# Patient Record
Sex: Female | Born: 1990 | Race: White | Hispanic: No | Smoking: Current every day smoker
Health system: Southern US, Community
[De-identification: ages and names within clinical notes are randomized; demographics above are authoritative.]

## PROBLEM LIST (undated history)

## (undated) DIAGNOSIS — F99 Mental disorder, not otherwise specified: Secondary | ICD-10-CM

## (undated) DIAGNOSIS — L089 Local infection of the skin and subcutaneous tissue, unspecified: Secondary | ICD-10-CM

## (undated) DIAGNOSIS — N83209 Unspecified ovarian cyst, unspecified side: Secondary | ICD-10-CM

## (undated) DIAGNOSIS — K056 Periodontal disease, unspecified: Secondary | ICD-10-CM

## (undated) HISTORY — PX: OVARIAN CYST REMOVAL: SHX89

## (undated) HISTORY — PX: TONSILLECTOMY: SHX5217

## (undated) HISTORY — DX: Periodontal disease, unspecified: K05.6

## (undated) HISTORY — PX: MULTIPLE TOOTH EXTRACTIONS: SHX2053

## (undated) HISTORY — PX: WISDOM TOOTH EXTRACTION: SHX21

---
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
## 2010-05-28 ENCOUNTER — Emergency Department (HOSPITAL_COMMUNITY): Admission: EM | Admit: 2010-05-28 | Discharge: 2010-05-28 | Payer: Self-pay | Admitting: Family Medicine

## 2010-11-29 ENCOUNTER — Encounter: Payer: Self-pay | Admitting: Family Medicine

## 2010-12-29 ENCOUNTER — Inpatient Hospital Stay (INDEPENDENT_AMBULATORY_CARE_PROVIDER_SITE_OTHER)
Admission: RE | Admit: 2010-12-29 | Discharge: 2010-12-29 | Disposition: A | Discharge status: Home / Self Care | Payer: Self-pay | Source: Ambulatory Visit | Attending: Family Medicine | Admitting: Family Medicine

## 2010-12-29 DIAGNOSIS — F411 Generalized anxiety disorder: Secondary | ICD-10-CM

## 2010-12-29 DIAGNOSIS — N739 Female pelvic inflammatory disease, unspecified: Secondary | ICD-10-CM

## 2010-12-29 LAB — POCT URINALYSIS DIPSTICK
Ketones, ur: NEGATIVE mg/dL
Nitrite: NEGATIVE
Protein, ur: 30 mg/dL — AB
Urobilinogen, UA: 0.2 mg/dL (ref 0.0–1.0)

## 2010-12-29 LAB — WET PREP, GENITAL: Trich, Wet Prep: NONE SEEN

## 2010-12-30 LAB — HIV ANTIBODY (ROUTINE TESTING W REFLEX): HIV: NONREACTIVE

## 2011-01-18 ENCOUNTER — Inpatient Hospital Stay (INDEPENDENT_AMBULATORY_CARE_PROVIDER_SITE_OTHER)
Admission: RE | Admit: 2011-01-18 | Discharge: 2011-01-18 | Disposition: A | Discharge status: Home / Self Care | Payer: Self-pay | Source: Ambulatory Visit | Attending: Family Medicine | Admitting: Family Medicine

## 2011-01-18 DIAGNOSIS — R109 Unspecified abdominal pain: Secondary | ICD-10-CM

## 2011-01-19 ENCOUNTER — Inpatient Hospital Stay (HOSPITAL_COMMUNITY)
Admission: AD | Admit: 2011-01-19 | Discharge: 2011-01-19 | Disposition: A | Discharge status: Home / Self Care | Payer: Self-pay | Source: Ambulatory Visit | Attending: Obstetrics & Gynecology | Admitting: Obstetrics & Gynecology

## 2011-01-19 ENCOUNTER — Inpatient Hospital Stay (HOSPITAL_COMMUNITY): Payer: Self-pay

## 2011-01-19 DIAGNOSIS — R102 Pelvic and perineal pain: Secondary | ICD-10-CM

## 2011-01-19 DIAGNOSIS — R109 Unspecified abdominal pain: Secondary | ICD-10-CM | POA: Insufficient documentation

## 2011-01-19 LAB — CBC
HCT: 38.7 % (ref 36.0–46.0)
Hemoglobin: 12.3 g/dL (ref 12.0–15.0)
MCH: 27.5 pg (ref 26.0–34.0)
MCHC: 31.8 g/dL (ref 30.0–36.0)
Platelets: 197 10*3/uL (ref 150–400)
RBC: 4.48 MIL/uL (ref 3.87–5.11)
RDW: 13.6 % (ref 11.5–15.5)

## 2011-01-19 LAB — WET PREP, GENITAL
Clue Cells Wet Prep HPF POC: NONE SEEN
Trich, Wet Prep: NONE SEEN

## 2011-01-19 LAB — URINALYSIS, ROUTINE W REFLEX MICROSCOPIC
Bilirubin Urine: NEGATIVE
Ketones, ur: NEGATIVE mg/dL
Nitrite: NEGATIVE
Urobilinogen, UA: 0.2 mg/dL (ref 0.0–1.0)

## 2011-01-19 LAB — DIFFERENTIAL
Eosinophils Relative: 4 % (ref 0–5)
Neutrophils Relative %: 59 % (ref 43–77)

## 2011-01-20 LAB — GC/CHLAMYDIA PROBE AMP, GENITAL
Chlamydia, DNA Probe: NEGATIVE
GC Probe Amp, Genital: NEGATIVE

## 2011-01-23 LAB — POCT URINALYSIS DIP (DEVICE)
Bilirubin Urine: NEGATIVE
Glucose, UA: NEGATIVE mg/dL
Ketones, ur: NEGATIVE mg/dL
pH: 5.5 (ref 5.0–8.0)

## 2011-01-23 LAB — URINE CULTURE

## 2011-02-05 ENCOUNTER — Emergency Department (HOSPITAL_COMMUNITY)
Admission: EM | Admit: 2011-02-05 | Discharge: 2011-02-05 | Disposition: A | Discharge status: Home / Self Care | Payer: Self-pay | Attending: Emergency Medicine | Admitting: Emergency Medicine

## 2011-02-05 DIAGNOSIS — R109 Unspecified abdominal pain: Secondary | ICD-10-CM | POA: Insufficient documentation

## 2011-02-05 DIAGNOSIS — Z87442 Personal history of urinary calculi: Secondary | ICD-10-CM | POA: Insufficient documentation

## 2011-02-05 LAB — DIFFERENTIAL
Basophils Absolute: 0 10*3/uL (ref 0.0–0.1)
Basophils Relative: 0 % (ref 0–1)
Eosinophils Relative: 2 % (ref 0–5)
Lymphs Abs: 3.2 10*3/uL (ref 0.7–4.0)
Monocytes Absolute: 1.3 10*3/uL — ABNORMAL HIGH (ref 0.1–1.0)
Neutrophils Relative %: 67 % (ref 43–77)

## 2011-02-05 LAB — CBC
HCT: 39.7 % (ref 36.0–46.0)
MCV: 86.1 fL (ref 78.0–100.0)
Platelets: 176 10*3/uL (ref 150–400)
RBC: 4.61 MIL/uL (ref 3.87–5.11)
WBC: 14.3 10*3/uL — ABNORMAL HIGH (ref 4.0–10.5)

## 2011-02-05 LAB — POCT I-STAT, CHEM 8
BUN: 16 mg/dL (ref 6–23)
Chloride: 105 mEq/L (ref 96–112)
Glucose, Bld: 87 mg/dL (ref 70–99)
Potassium: 4 mEq/L (ref 3.5–5.1)
Sodium: 139 mEq/L (ref 135–145)
TCO2: 25 mmol/L (ref 0–100)

## 2011-02-05 LAB — URINALYSIS, ROUTINE W REFLEX MICROSCOPIC
Bilirubin Urine: NEGATIVE
Hgb urine dipstick: NEGATIVE
Ketones, ur: NEGATIVE mg/dL
Nitrite: NEGATIVE

## 2011-02-05 LAB — POCT PREGNANCY, URINE: Preg Test, Ur: NEGATIVE

## 2011-02-06 LAB — URINE CULTURE
Colony Count: NO GROWTH
Culture  Setup Time: 201203301101

## 2011-04-13 ENCOUNTER — Inpatient Hospital Stay (INDEPENDENT_AMBULATORY_CARE_PROVIDER_SITE_OTHER)
Admission: RE | Admit: 2011-04-13 | Discharge: 2011-04-13 | Disposition: A | Discharge status: Home / Self Care | Payer: Self-pay | Source: Ambulatory Visit | Attending: Family Medicine | Admitting: Family Medicine

## 2011-04-13 DIAGNOSIS — N390 Urinary tract infection, site not specified: Secondary | ICD-10-CM

## 2011-04-13 DIAGNOSIS — R10819 Abdominal tenderness, unspecified site: Secondary | ICD-10-CM

## 2011-04-13 LAB — CBC
MCH: 27.4 pg (ref 26.0–34.0)
MCHC: 32.4 g/dL (ref 30.0–36.0)
Platelets: 204 10*3/uL (ref 150–400)
RDW: 13.7 % (ref 11.5–15.5)

## 2011-04-13 LAB — POCT URINALYSIS DIP (DEVICE)
Glucose, UA: NEGATIVE mg/dL
Nitrite: POSITIVE — AB
Protein, ur: 30 mg/dL — AB
Urobilinogen, UA: 0.2 mg/dL (ref 0.0–1.0)
pH: 6.5 (ref 5.0–8.0)

## 2011-04-13 LAB — POCT I-STAT, CHEM 8
Calcium, Ion: 1.18 mmol/L (ref 1.12–1.32)
Creatinine, Ser: 0.8 mg/dL (ref 0.4–1.2)
Glucose, Bld: 90 mg/dL (ref 70–99)
Hemoglobin: 13.6 g/dL (ref 12.0–15.0)
Sodium: 141 mEq/L (ref 135–145)
TCO2: 25 mmol/L (ref 0–100)

## 2011-04-15 LAB — URINE CULTURE

## 2014-01-28 ENCOUNTER — Emergency Department (HOSPITAL_COMMUNITY)
Admission: EM | Admit: 2014-01-28 | Discharge: 2014-01-28 | Discharge status: Against Medical Advice | Payer: Self-pay | Attending: Emergency Medicine | Admitting: Emergency Medicine

## 2014-01-28 ENCOUNTER — Encounter (HOSPITAL_COMMUNITY): Payer: Self-pay | Admitting: Emergency Medicine

## 2014-01-28 DIAGNOSIS — K921 Melena: Secondary | ICD-10-CM | POA: Insufficient documentation

## 2014-01-28 DIAGNOSIS — R1032 Left lower quadrant pain: Secondary | ICD-10-CM | POA: Insufficient documentation

## 2014-01-28 DIAGNOSIS — Z3202 Encounter for pregnancy test, result negative: Secondary | ICD-10-CM | POA: Insufficient documentation

## 2014-01-28 DIAGNOSIS — F17200 Nicotine dependence, unspecified, uncomplicated: Secondary | ICD-10-CM | POA: Insufficient documentation

## 2014-01-28 DIAGNOSIS — Z8742 Personal history of other diseases of the female genital tract: Secondary | ICD-10-CM | POA: Insufficient documentation

## 2014-01-28 DIAGNOSIS — N898 Other specified noninflammatory disorders of vagina: Secondary | ICD-10-CM | POA: Insufficient documentation

## 2014-01-28 DIAGNOSIS — R197 Diarrhea, unspecified: Secondary | ICD-10-CM | POA: Insufficient documentation

## 2014-01-28 HISTORY — DX: Unspecified ovarian cyst, unspecified side: N83.209

## 2014-01-28 LAB — COMPREHENSIVE METABOLIC PANEL
ALBUMIN: 3.9 g/dL (ref 3.5–5.2)
ALK PHOS: 97 U/L (ref 39–117)
ALT: 10 U/L (ref 0–35)
AST: 19 U/L (ref 0–37)
BUN: 11 mg/dL (ref 6–23)
CO2: 24 mEq/L (ref 19–32)
Calcium: 9.2 mg/dL (ref 8.4–10.5)
Chloride: 103 mEq/L (ref 96–112)
Creatinine, Ser: 0.5 mg/dL (ref 0.50–1.10)
GFR calc Af Amer: >90 mL/min (ref >90)
GFR calc non Af Amer: >90 mL/min (ref >90)
Glucose, Bld: 76 mg/dL (ref 70–99)
POTASSIUM: 3.8 meq/L (ref 3.7–5.3)
Sodium: 141 mEq/L (ref 137–147)
TOTAL PROTEIN: 7.4 g/dL (ref 6.0–8.3)
Total Bilirubin: 0.6 mg/dL (ref 0.3–1.2)

## 2014-01-28 LAB — CBC WITH DIFFERENTIAL/PLATELET
BASOS ABS: 0 10*3/uL (ref 0.0–0.1)
BASOS PCT: 0 % (ref 0–1)
EOS ABS: 0.2 10*3/uL (ref 0.0–0.7)
Eosinophils Relative: 3 % (ref 0–5)
HCT: 38.6 % (ref 36.0–46.0)
HEMOGLOBIN: 13 g/dL (ref 12.0–15.0)
Lymphocytes Relative: 30 % (ref 12–46)
Lymphs Abs: 2.2 10*3/uL (ref 0.7–4.0)
MCH: 28.6 pg (ref 26.0–34.0)
MCHC: 33.7 g/dL (ref 30.0–36.0)
MCV: 84.8 fL (ref 78.0–100.0)
MONOS PCT: 14 % — AB (ref 3–12)
Monocytes Absolute: 1 10*3/uL (ref 0.1–1.0)
NEUTROS PCT: 52 % (ref 43–77)
Neutro Abs: 3.8 10*3/uL (ref 1.7–7.7)
PLATELETS: 195 10*3/uL (ref 150–400)
RBC: 4.55 MIL/uL (ref 3.87–5.11)
RDW: 13.5 % (ref 11.5–15.5)
WBC: 7.2 10*3/uL (ref 4.0–10.5)

## 2014-01-28 LAB — URINALYSIS, ROUTINE W REFLEX MICROSCOPIC
Bilirubin Urine: NEGATIVE
Glucose, UA: NEGATIVE mg/dL
Hgb urine dipstick: NEGATIVE
Ketones, ur: NEGATIVE mg/dL
Leukocytes, UA: NEGATIVE
NITRITE: NEGATIVE
Protein, ur: NEGATIVE mg/dL
SPECIFIC GRAVITY, URINE: 1.021 (ref 1.005–1.030)
UROBILINOGEN UA: 0.2 mg/dL (ref 0.0–1.0)
pH: 6 (ref 5.0–8.0)

## 2014-01-28 LAB — WET PREP, GENITAL
Trich, Wet Prep: NONE SEEN
WBC, Wet Prep HPF POC: NONE SEEN
YEAST WET PREP: NONE SEEN

## 2014-01-28 LAB — POC URINE PREG, ED: PREG TEST UR: NEGATIVE

## 2014-01-28 LAB — POC OCCULT BLOOD, ED: FECAL OCCULT BLD: NEGATIVE

## 2014-01-28 LAB — LIPASE, BLOOD: LIPASE: 13 U/L (ref 11–59)

## 2014-01-28 MED ORDER — OXYCODONE HCL 5 MG PO TABS
10.0000 mg | ORAL_TABLET | Freq: Once | ORAL | Status: AC
  Administered 2014-01-28: 5 mg via ORAL
  Filled 2014-01-28: qty 2

## 2014-01-28 MED ORDER — IOHEXOL 300 MG/ML  SOLN: 25.0000 mL | INTRAMUSCULAR | Status: DC

## 2014-01-28 NOTE — Discharge Instructions (Signed)
1. Medications: usual home medications 2. Treatment: rest, drink plenty of fluids,  3. Follow Up: Please followup with your primary doctor for further evaluation after today's visit; if you do not have a primary care doctor use the resource guide provided to find one;  please return to the emergency department if your symptoms persist, worsen or you become concerned in any way.  We have chosen to leave AGAINST MEDICAL ADVICE.  You're welcome to return to this emergency department for further evaluation at any time the you're concerned about your symptoms.   Abdominal Pain, Women Abdominal (stomach, pelvic, or belly) pain can be caused by many things. It is important to tell your doctor:  The location of the pain.  Does it come and go or is it present all the time?  Are there things that start the pain (eating certain foods, exercise)?  Are there other symptoms associated with the pain (fever, nausea, vomiting, diarrhea)? All of this is helpful to know when trying to find the cause of the pain. CAUSES   Stomach: virus or bacteria infection, or ulcer.  Intestine: appendicitis (inflamed appendix), regional ileitis (Crohn's disease), ulcerative colitis (inflamed colon), irritable bowel syndrome, diverticulitis (inflamed diverticulum of the colon), or cancer of the stomach or intestine.  Gallbladder disease or stones in the gallbladder.  Kidney disease, kidney stones, or infection.  Pancreas infection or cancer.  Fibromyalgia (pain disorder).  Diseases of the female organs:  Uterus: fibroid (non-cancerous) tumors or infection.  Fallopian tubes: infection or tubal pregnancy.  Ovary: cysts or tumors.  Pelvic adhesions (scar tissue).  Endometriosis (uterus lining tissue growing in the pelvis and on the pelvic organs).  Pelvic congestion syndrome (female organs filling up with blood just before the menstrual period).  Pain with the menstrual period.  Pain with ovulation  (producing an egg).  Pain with an IUD (intrauterine device, birth control) in the uterus.  Cancer of the female organs.  Functional pain (pain not caused by a disease, may improve without treatment).  Psychological pain.  Depression. DIAGNOSIS  Your doctor will decide the seriousness of your pain by doing an examination.  Blood tests.  X-rays.  Ultrasound.  CT scan (computed tomography, special type of X-ray).  MRI (magnetic resonance imaging).  Cultures, for infection.  Barium enema (dye inserted in the large intestine, to better view it with X-rays).  Colonoscopy (looking in intestine with a lighted tube).  Laparoscopy (minor surgery, looking in abdomen with a lighted tube).  Major abdominal exploratory surgery (looking in abdomen with a large incision). TREATMENT  The treatment will depend on the cause of the pain.   Many cases can be observed and treated at home.  Over-the-counter medicines recommended by your caregiver.  Prescription medicine.  Antibiotics, for infection.  Birth control pills, for painful periods or for ovulation pain.  Hormone treatment, for endometriosis.  Nerve blocking injections.  Physical therapy.  Antidepressants.  Counseling with a psychologist or psychiatrist.  Minor or major surgery. HOME CARE INSTRUCTIONS   Do not take laxatives, unless directed by your caregiver.  Take over-the-counter pain medicine only if ordered by your caregiver. Do not take aspirin because it can cause an upset stomach or bleeding.  Try a clear liquid diet (broth or water) as ordered by your caregiver. Slowly move to a bland diet, as tolerated, if the pain is related to the stomach or intestine.  Have a thermometer and take your temperature several times a day, and record it.  Bed rest and  sleep, if it helps the pain.  Avoid sexual intercourse, if it causes pain.  Avoid stressful situations.  Keep your follow-up appointments and tests, as  your caregiver orders.  If the pain does not go away with medicine or surgery, you may try:  Acupuncture.  Relaxation exercises (yoga, meditation).  Group therapy.  Counseling. SEEK MEDICAL CARE IF:   You notice certain foods cause stomach pain.  Your home care treatment is not helping your pain.  You need stronger pain medicine.  You want your IUD removed.  You feel faint or lightheaded.  You develop nausea and vomiting.  You develop a rash.  You are having side effects or an allergy to your medicine. SEEK IMMEDIATE MEDICAL CARE IF:   Your pain does not go away or gets worse.  You have a fever.  Your pain is felt only in portions of the abdomen. The right side could possibly be appendicitis. The left lower portion of the abdomen could be colitis or diverticulitis.  You are passing blood in your stools (bright red or black tarry stools, with or without vomiting).  You have blood in your urine.  You develop chills, with or without a fever.  You pass out. MAKE SURE YOU:   Understand these instructions.  Will watch your condition.  Will get help right away if you are not doing well or get worse. Document Released: 08/22/2007 Document Revised: 01/17/2012 Document Reviewed: 09/11/2009 [facility] Patient Information 2014 [place]Care, [place].

## 2014-01-28 NOTE — ED Notes (Signed)
Pt c/o lower abd pain X 2 weeks that has progressively gotten worse. Pt sts she has been having dark red stools, see it in toilet, denies hemorroids. Denies use of blood thinners. Denies n/v. Nad, skin warm and dry, resp e/u.

## 2014-01-28 NOTE — ED Provider Notes (Signed)
CSN: [id number]     Arrival date & time 01/28/14  1549 History   First MD Initiated Contact with Patient 01/28/14 1836     Chief Complaint  Patient presents with  . Abdominal Pain     (Consider location/radiation/quality/duration/timing/severity/associated sxs/prior Treatment) The history is provided by the patient and medical records. No language interpreter was used.    Lorraine Paul is a 23 y.o. female  with a hx of ovarian cyst presents to the Emergency Department complaining of gradual, persistent, progressively worsening lower abd pain onset several months ago, worsening in the last week and acutely worse today.  Pt reports pain is located in the left pelvis and left lower quadrant. Associated symptoms include bloody diarrhea that is maroon colored for several weeks. Pt denies constipation, rectal pain, nausea and vomiting.  She reports she may have ovarian cancer, but she is uncertain about this due to some confusion with her OB/GYN.. She reports she has a follow-up appointment with the GYN in 2 days.  Pt reports measured fever to 101 two days ago with associated chills, but no fever today.  She reports that it is the pain that brought her in today. Nothing makes it better or worse. Pt denies headache, neck pain, rash, chest pain, shortness of breath, vomiting, weakness, dizziness, syncope, dysuria, hematuria.     Past Medical History  Diagnosis Date  . Ovarian cyst    Past Surgical History  Procedure Laterality Date  . Ovarian cyst removal     No family history on file. History  Substance Use Topics  . Smoking status: Current Every Day Smoker -- 0.25 packs/day    Types: Cigarettes  . Smokeless tobacco: Not on file  . Alcohol Use: Yes     Comment: rarely   OB History   Grav Para Term Preterm Abortions TAB SAB Ect Mult Living                 Review of Systems  Constitutional: Negative for fever, diaphoresis, appetite change, fatigue and unexpected weight change.  HENT:  Negative for mouth sores and trouble swallowing.   Respiratory: Negative for cough, chest tightness, shortness of breath, wheezing and stridor.   Cardiovascular: Negative for chest pain and palpitations.  Gastrointestinal: Positive for abdominal pain, diarrhea and blood in stool. Negative for nausea, vomiting, constipation, abdominal distention and rectal pain.  Genitourinary: Negative for dysuria, urgency, frequency, hematuria, flank pain and difficulty urinating.  Musculoskeletal: Negative for back pain, neck pain and neck stiffness.  Skin: Negative for rash.  Neurological: Negative for weakness.  Hematological: Negative for adenopathy.  Psychiatric/Behavioral: Negative for confusion.  All other systems reviewed and are negative.      Allergies  Asa; Motrin; and Tylenol  Home Medications   No current outpatient prescriptions on file. BP 135/77  Pulse 93  Temp(Src) 98.8 F (37.1 C) (Oral)  Resp 17  SpO2 99% Physical Exam  Nursing note and vitals reviewed. Constitutional: She is oriented to person, place, and time. She appears well-developed and well-nourished. No distress.  Awake, alert, nontoxic appearance  HENT:  Head: Normocephalic and atraumatic.  Mouth/Throat: Oropharynx is clear and moist. No oropharyngeal exudate.  Eyes: Conjunctivae are normal. Pupils are equal, round, and reactive to light. No scleral icterus.  Neck: Normal range of motion. Neck supple.  Cardiovascular: Normal rate, regular rhythm, normal heart sounds and intact distal pulses.   No murmur heard. No Tachycardia  Pulmonary/Chest: Effort normal and breath sounds normal. No respiratory distress. She has  no wheezes. She has no rales. She exhibits no tenderness.  Abdominal: Soft. Normal appearance and bowel sounds are normal. She exhibits no distension and no mass. There is no hepatosplenomegaly. There is tenderness. There is guarding. There is no rigidity, no rebound and no CVA tenderness.  Left lower  quadrant abdominal pain with guarding - small mass palpable in the subcutaneous tissue of the left lower quadrant with guarding on palpation No pulsatile mass No CVA tenderness No rigidity or peritoneal signs No hepatosplenomegaly No abdominal distention  Genitourinary: Uterus normal. Rectal exam shows no external hemorrhoid, no internal hemorrhoid, no fissure, no mass, no tenderness and anal tone normal. Guaiac negative stool. Pelvic exam was performed with patient supine. There is no rash, tenderness, lesion or injury on the right labia. There is no rash, tenderness, lesion or injury on the left labia. Uterus is not deviated, not enlarged, not fixed and not tender. Cervix exhibits no motion tenderness, no discharge and no friability. Right adnexum displays no mass, no tenderness and no fullness. Left adnexum displays no mass, no tenderness and no fullness. No erythema, tenderness or bleeding around the vagina. No foreign body around the vagina. No signs of injury around the vagina. Vaginal discharge (mild, white, thick,  minimal) found.  Brown, hard stool in the rectal vault No evidence of external or internal hemorrhoid No rectal tenderness No palpable rectal mass No cervical motion tenderness No adnexal fullness or tenderness  Musculoskeletal: Normal range of motion. She exhibits no edema.  Lymphadenopathy:    She has no cervical adenopathy.  Neurological: She is alert and oriented to person, place, and time. She exhibits normal muscle tone. Coordination normal.  Speech is clear and goal oriented Moves extremities without ataxia  Skin: Skin is warm and dry. No rash noted. She is not diaphoretic. No erythema.  Psychiatric: She has a normal mood and affect. Her behavior is normal.    ED Course  Procedures (including critical care time) Labs Review Labs Reviewed  CBC WITH DIFFERENTIAL - Abnormal; Notable for the following:    Monocytes Relative 14 (*)    All other components within  normal limits  WET PREP, GENITAL  GC/CHLAMYDIA PROBE AMP  COMPREHENSIVE METABOLIC PANEL  LIPASE, BLOOD  URINALYSIS, ROUTINE W REFLEX MICROSCOPIC  POC URINE PREG, ED  POC OCCULT BLOOD, ED   Imaging Review No results found.   EKG Interpretation None      MDM   Final diagnoses:  LLQ abdominal pain   Lorraine Paul presents with left lower quadrant abdominal pain and complaints of bloody diarrhea along with fevers this week.  DRE with brown, hard stool in the rectal vault. No gross blood on exam, no hemorrhoids.  No adnexal masses or tenderness, pregnancy test negative.  Low likelihood of ectopic pregnancy.  Concern for possible colon cancer, ovarian cancer, diverticulosis or diverticulitis. Patient is otherwise young and healthy.    Patient without leukocytosis on labs reassuring. No evidence of urinary tract infection. Fecal occult blood negative.    8:25PM At this time patient informs me that she is unable to stay for further evaluation and treatment because she must go to work. I have recommended that she proceed with the CT scan as I am concerned about her symptoms and palpable mass in the left lower abdomin.  Patient reports that she understands my concerns but will lose her job if she does not go to work.  Patient has both the competency and capacity to make this decision. She is  able to repeat back for me my concerns and her risks of leaving including bowel perforation, internal bleeding and even the potential for death. She reports that she will return to the emergency department later this evening after her job is complete for completion of her workup.  She will be discharged AMA at this time. She is alert, oriented, nontoxic and nonseptic appearing. Her vital signs are stable.  Dr. Manus Gunning was made aware that pt is leaving AMA.    BP 135/77  Pulse 93  Temp(Src) 98.8 F (37.1 C) (Oral)  Resp 17  SpO2 99%    Dierdre Forth, PA-C 01/28/14 2035  Dierdre Forth, PA-C 01/28/14 2036

## 2014-01-28 NOTE — ED Notes (Signed)
Pt reports she is unable to stay for further evaluation because she has to be somewhere, states she is in pain but "just cannot stay any longer".  Dahlia ClientHannah, PA at bedside to inform pt that it is against medical advice to leave at this point, pt repeated information.  Pt states she will come back.  Ambulatory to lobby without difficulty.

## 2014-01-29 LAB — GC/CHLAMYDIA PROBE AMP
CT Probe RNA: NEGATIVE
GC Probe RNA: NEGATIVE

## 2014-01-29 NOTE — ED Provider Notes (Signed)
Medical screening examination/treatment/procedure(s) were performed by non-physician practitioner and as supervising physician I was immediately available for consultation/collaboration.   EKG Interpretation None        Lorraine OctaveStephen Uriel Dowding, MD 01/29/14 [phone]

## 2014-11-08 DIAGNOSIS — L089 Local infection of the skin and subcutaneous tissue, unspecified: Secondary | ICD-10-CM

## 2014-11-08 HISTORY — DX: Local infection of the skin and subcutaneous tissue, unspecified: L08.9

## 2015-03-31 ENCOUNTER — Encounter (HOSPITAL_COMMUNITY): Payer: Self-pay | Admitting: *Deleted

## 2015-03-31 ENCOUNTER — Emergency Department (HOSPITAL_COMMUNITY)
Admission: EM | Admit: 2015-03-31 | Discharge: 2015-03-31 | Disposition: A | Discharge status: Home / Self Care | Payer: Self-pay | Attending: Emergency Medicine | Admitting: Emergency Medicine

## 2015-03-31 DIAGNOSIS — L049 Acute lymphadenitis, unspecified: Secondary | ICD-10-CM | POA: Insufficient documentation

## 2015-03-31 DIAGNOSIS — Z8742 Personal history of other diseases of the female genital tract: Secondary | ICD-10-CM | POA: Insufficient documentation

## 2015-03-31 DIAGNOSIS — L02414 Cutaneous abscess of left upper limb: Secondary | ICD-10-CM | POA: Insufficient documentation

## 2015-03-31 DIAGNOSIS — Z720 Tobacco use: Secondary | ICD-10-CM | POA: Insufficient documentation

## 2015-03-31 MED ORDER — SULFAMETHOXAZOLE-TRIMETHOPRIM 800-160 MG PO TABS: 1.0000 | ORAL_TABLET | Freq: Two times a day (BID) | ORAL | Status: DC

## 2015-03-31 MED ORDER — LIDOCAINE HCL (PF) 1 % IJ SOLN: 5.0000 mL | Freq: Once | INTRAMUSCULAR | Status: DC

## 2015-03-31 MED ORDER — KETOROLAC TROMETHAMINE 60 MG/2ML IM SOLN
60.0000 mg | Freq: Once | INTRAMUSCULAR | Status: AC
  Administered 2015-03-31: 60 mg via INTRAMUSCULAR
  Filled 2015-03-31: qty 2

## 2015-03-31 MED ORDER — TRAMADOL HCL 50 MG PO TABS: 100.0000 mg | ORAL_TABLET | Freq: Four times a day (QID) | ORAL | Status: DC | PRN

## 2015-03-31 MED ORDER — LIDOCAINE-EPINEPHRINE (PF) 2 %-1:200000 IJ SOLN
10.0000 mL | Freq: Once | INTRAMUSCULAR | Status: AC
  Administered 2015-03-31: 10 mL
  Filled 2015-03-31: qty 20

## 2015-03-31 MED ORDER — SULFAMETHOXAZOLE-TRIMETHOPRIM 800-160 MG PO TABS
1.0000 | ORAL_TABLET | Freq: Once | ORAL | Status: AC
  Administered 2015-03-31: 1 via ORAL
  Filled 2015-03-31: qty 1

## 2015-03-31 NOTE — ED Notes (Signed)
Red swollen area to the lt upper forearm  Just below the elbow,  2 red streaks running upward in the arm.  She was bitten by a spider  approx 2 weeks ago. She denies drug use for 2 years.  

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
## 2015-03-31 NOTE — ED Notes (Addendum)
Pt in c/o possible spider bite to her left forearm, large red bump with swelling noted, bruising to other areas of forearm, symptoms x1 week, streaking noted onto upper arm. 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

## 2015-03-31 NOTE — Discharge Instructions (Signed)
Change the dressing daily. Take the antibiotics until gone, you got the first dose in the ED this morning. Take the tramadol for pain with ibuprofen 600 mg 4 times a day. Recheck if you get worse, such as worsening redness or swelling going up your arm, persistent high fever, or worsening pain.    Abscess Care After An abscess (also called a boil or furuncle) is an infected area that contains a collection of pus. Signs and symptoms of an abscess include pain, tenderness, redness, or hardness, or you may feel a moveable soft area under your skin. An abscess can occur anywhere in the body. The infection may spread to surrounding tissues causing cellulitis. A cut (incision) by the surgeon was made over your abscess and the pus was drained out. Gauze may have been packed into the space to provide a drain that will allow the cavity to heal from the inside outwards. The boil may be painful for 5 to 7 days. Most people with a boil do not have high fevers. Your abscess, if seen early, may not have localized, and may not have been lanced. If not, another appointment may be required for this if it does not get better on its own or with medications. HOME CARE INSTRUCTIONS   Only take over-the-counter or prescription medicines for pain, discomfort, or fever as directed by your caregiver.  When you bathe, soak and then remove gauze or iodoform packs at least daily or as directed by your caregiver. You may then wash the wound gently with mild soapy water. Repack with gauze or do as your caregiver directs. SEEK IMMEDIATE MEDICAL CARE IF:   You develop increased pain, swelling, redness, drainage, or bleeding in the wound site.  You develop signs of generalized infection including muscle aches, chills, fever, or a general ill feeling.  An oral temperature above 102 F (38.9 C) develops, not controlled by medication. See your caregiver for a recheck if you develop any of the symptoms described above. If medications  (antibiotics) were prescribed, take them as directed. Document Released: 05/13/2005 Document Revised: 01/17/2012 Document Reviewed: 01/08/2008 [facility] Patient Information 2015 [place], [place]. This information is not intended to replace advice given to you by your health care provider. Make sure you discuss any questions you have with your health care provider.  Abscess An abscess is an infected area that contains a collection of pus and debris.It can occur in almost any part of the body. An abscess is also known as a furuncle or boil. CAUSES  An abscess occurs when tissue gets infected. This can occur from blockage of oil or sweat glands, infection of hair follicles, or a minor injury to the skin. As the body tries to fight the infection, pus collects in the area and creates pressure under the skin. This pressure causes pain. People with weakened immune systems have difficulty fighting infections and get certain abscesses more often.  SYMPTOMS Usually an abscess develops on the skin and becomes a painful mass that is red, warm, and tender. If the abscess forms under the skin, you may feel a moveable soft area under the skin. Some abscesses break open (rupture) on their own, but most will continue to get worse without care. The infection can spread deeper into the body and eventually into the bloodstream, causing you to feel ill.  DIAGNOSIS  Your caregiver will take your medical history and perform a physical exam. A sample of fluid may also be taken from the abscess to determine what is  causing your infection. TREATMENT  Your caregiver may prescribe antibiotic medicines to fight the infection. However, taking antibiotics alone usually does not cure an abscess. Your caregiver may need to make a small cut (incision) in the abscess to drain the pus. In some cases, gauze is packed into the abscess to reduce pain and to continue draining the area. HOME CARE INSTRUCTIONS   Only take over-the-counter or  prescription medicines for pain, discomfort, or fever as directed by your caregiver.  If you were prescribed antibiotics, take them as directed. Finish them even if you start to feel better.  If gauze is used, follow your caregiver's directions for changing the gauze.  To avoid spreading the infection:  Keep your draining abscess covered with a bandage.  Wash your hands well.  Do not share personal care items, towels, or whirlpools with others.  Avoid skin contact with others.  Keep your skin and clothes clean around the abscess.  Keep all follow-up appointments as directed by your caregiver. SEEK MEDICAL CARE IF:   You have increased pain, swelling, redness, fluid drainage, or bleeding.  You have muscle aches, chills, or a general ill feeling.  You have a fever. MAKE SURE YOU:   Understand these instructions.  Will watch your condition.  Will get help right away if you are not doing well or get worse. Document Released: 08/04/2005 Document Revised: 04/25/2012 Document Reviewed: 01/07/2012 [facility] Patient Information 2015 [place]. This information is not intended to replace advice given to you by your health care provider. Make sure you discuss any questions you have with your health care provider.

## 2015-03-31 NOTE — ED Provider Notes (Signed)
INCISION AND DRAINAGE Performed by: Harle Battiestysinger, Clorissa Gruenberg Consent: Verbal consent obtained. Risks and benefits: risks, benefits and alternatives were discussed Type: abscess  Body area: left posterior, proximal forearm  Anesthesia: local infiltration  Incision was made with a scalpel.  Local anesthetic: lidocaine 2% with epinephrine  Anesthetic total: 4 ml  Complexity: complex Blunt dissection to break up loculations  Drainage: purulent  Drainage amount: large  Packing material: N/A  Patient tolerance: Patient tolerated the procedure well with no immediate complications.     Harle BattiestElizabeth Karilynn Carranza, NP 03/31/15 [id number]  Devoria AlbeIva Knapp, MD 03/31/15 [phone]

## 2015-06-07 ENCOUNTER — Encounter (HOSPITAL_COMMUNITY): Payer: Self-pay

## 2015-06-07 ENCOUNTER — Emergency Department (HOSPITAL_COMMUNITY)
Admission: EM | Admit: 2015-06-07 | Discharge: 2015-06-07 | Disposition: A | Discharge status: Home / Self Care | Payer: Self-pay | Attending: Emergency Medicine | Admitting: Emergency Medicine

## 2015-06-07 DIAGNOSIS — L02414 Cutaneous abscess of left upper limb: Secondary | ICD-10-CM | POA: Insufficient documentation

## 2015-06-07 DIAGNOSIS — Z79899 Other long term (current) drug therapy: Secondary | ICD-10-CM | POA: Insufficient documentation

## 2015-06-07 DIAGNOSIS — Z720 Tobacco use: Secondary | ICD-10-CM | POA: Insufficient documentation

## 2015-06-07 DIAGNOSIS — Z8742 Personal history of other diseases of the female genital tract: Secondary | ICD-10-CM | POA: Insufficient documentation

## 2015-06-07 DIAGNOSIS — L0291 Cutaneous abscess, unspecified: Secondary | ICD-10-CM

## 2015-06-07 DIAGNOSIS — Z23 Encounter for immunization: Secondary | ICD-10-CM | POA: Insufficient documentation

## 2015-06-07 HISTORY — DX: Local infection of the skin and subcutaneous tissue, unspecified: L08.9

## 2015-06-07 MED ORDER — SULFAMETHOXAZOLE-TRIMETHOPRIM 800-160 MG PO TABS: 1.0000 | ORAL_TABLET | Freq: Two times a day (BID) | ORAL | Status: DC

## 2015-06-07 MED ORDER — CEPHALEXIN 500 MG PO CAPS: 500.0000 mg | ORAL_CAPSULE | Freq: Three times a day (TID) | ORAL | Status: DC

## 2015-06-07 NOTE — Discharge Instructions (Signed)
Abscess °An abscess (boil or furuncle) is an infected area on or under the skin. This area is filled with yellowish-white fluid (pus) and other material (debris). °HOME CARE  °· Only take medicines as told by your doctor. °· If you were given antibiotic medicine, take it as directed. Finish the medicine even if you start to feel better. °· If gauze is used, follow your doctor's directions for changing the gauze. °· To avoid spreading the infection: °¨ Keep your abscess covered with a bandage. °¨ Wash your hands well. °¨ Do not share personal care items, towels, or whirlpools with others. °¨ Avoid skin contact with others. °· Keep your skin and clothes clean around the abscess. °· Keep all doctor visits as told. °GET HELP RIGHT AWAY IF:  °· You have more pain, puffiness (swelling), or redness in the wound site. °· You have more fluid or blood coming from the wound site. °· You have muscle aches, chills, or you feel sick. °· You have a fever. °MAKE SURE YOU:  °· Understand these instructions. °· Will watch your condition. °· Will get help right away if you are not doing well or get worse. °Document Released: 04/12/2008 Document Revised: 04/25/2012 Document Reviewed: 01/07/2012 °[employer]® Patient Information ©2015 [employer]. This information is not intended to replace advice given to you by your health care provider. Make sure you discuss any questions you have with your health care provider. ° °

## 2015-06-07 NOTE — ED Provider Notes (Signed)
CSN: [id number]     Arrival date & time 06/07/15  0539 History   First MD Initiated Contact with Patient 06/07/15 [id number]     Chief Complaint  Patient presents with  . Recurrent Skin Infections     (Consider location/radiation/quality/duration/timing/severity/associated sxs/prior Treatment) HPI  Ms. Posa is a 24yo F with hx of recurrent skin infections and past IVDU (roughly '2 years ago') who presents with swelling, pain, warmth, and redness of her lower inner forearm that started 3 days ago. She has noted over the past 3 days that it has grown in size, extending up her forearm to be now roughly the size of a golfball. She complains of 8/10 pain over the area and it is tense enough that it hurts when she moves her hand too much. She has tried warm compresses to no avail. She denies any fevers, chills, drainage, or any other symptoms. She says she just work up one morning and it was like this, and denies any trauma, bites, or recent IVDU. She is accompanied by her boyfriend whom she lives with, who also has a skin abscess that was just drained yesterday in his forearm.  Past Medical History  Diagnosis Date  . Ovarian cyst   . Skin infection 2016   Past Surgical History  Procedure Laterality Date  . Ovarian cyst removal     History reviewed. No pertinent family history. History  Substance Use Topics  . Smoking status: Current Every Day Smoker -- 0.25 packs/day    Types: Cigarettes  . Smokeless tobacco: Not on file  . Alcohol Use: Yes     Comment: rarely   OB History    No data available     Review of Systems  Constitutional: Negative for fever, chills and diaphoresis.  HENT: Negative for ear pain, sinus pressure and sore throat.   Eyes: Negative for visual disturbance.  Respiratory: Negative for cough, shortness of breath and wheezing.   Cardiovascular: Negative for chest pain, palpitations and leg swelling.  Gastrointestinal: Negative for nausea, vomiting, abdominal pain,  diarrhea, constipation and blood in stool.  Endocrine: Negative for polyuria.  Genitourinary: Negative for dysuria, urgency, frequency, flank pain, decreased urine volume and difficulty urinating.  Musculoskeletal: Negative for back pain and gait problem.  Skin: Positive for color change, rash and wound. Negative for pallor.  Neurological: Negative for dizziness, syncope, weakness, numbness and headaches.  Hematological: Does not bruise/bleed easily.  Psychiatric/Behavioral: Negative for confusion and agitation.  All other systems reviewed and are negative.     Allergies  Tylenol  Home Medications   Prior to Admission medications   Medication Sig Start Date End Date Taking? Authorizing Provider  alprazolam Prudy Feeler) 2 MG tablet Take 1-2 mg by mouth 3 (three) times daily as needed for anxiety.    Yes Historical Provider, MD  Asenapine Maleate 10 MG SUBL Place 1 each under the tongue daily.   Yes Historical Provider, MD  Lurasidone HCl (LATUDA) 20 MG TABS Take 5 mg by mouth daily.   Yes Historical Provider, MD  QUEtiapine (SEROQUEL XR) 400 MG 24 hr tablet Take 400 mg by mouth at bedtime.   Yes Historical Provider, MD  sulfamethoxazole-trimethoprim (BACTRIM DS,SEPTRA DS) 800-160 MG per tablet Take 1 tablet by mouth 2 (two) times daily. Patient not taking: Reported on 06/07/2015 03/31/15   Devoria Albe, MD  traMADol (ULTRAM) 50 MG tablet Take 2 tablets (100 mg total) by mouth every 6 (six) hours as needed. Patient not taking: Reported on 06/07/2015 03/31/15  Devoria Albe, MD   BP 104/56 mmHg  Pulse 79  Temp(Src) 98.1 F (36.7 C) (Oral)  Resp 20  SpO2 97%  LMP  (LMP Unknown) Physical Exam  Constitutional: She is oriented to person, place, and time. She appears well-nourished. No distress.  HENT:  Head: Normocephalic and atraumatic.  Right Ear: External ear normal.  Left Ear: External ear normal.  Eyes: Conjunctivae are normal. Pupils are equal, round, and reactive to light.  Neck: Normal  range of motion. Neck supple.  Cardiovascular: Normal rate, regular rhythm, normal heart sounds and intact distal pulses.  Exam reveals no gallop and no friction rub.   No murmur heard. Pulmonary/Chest: Effort normal and breath sounds normal. She has no wheezes. She has no rales.  Abdominal: Soft. Bowel sounds are normal. She exhibits no distension. There is no tenderness.  Musculoskeletal: Normal range of motion. She exhibits no edema or tenderness.  Neurological: She is alert and oriented to person, place, and time. She has normal reflexes. No cranial nerve deficit. Coordination normal.  The right hand has no sensory deficits and intact strength.  Skin: Skin is warm. She is not diaphoretic.  There is a roughly 4cm abscess in the distal flexor surface of the forearm with a small 1cm area of fluctuance. There is no drainage expressable. The area is surrounded by warmth and erythema.  Psychiatric: She has a normal mood and affect. Her behavior is normal.  Nursing note and vitals reviewed.   ED Course  Procedures (including critical care time) Labs Review Labs Reviewed - No data to display  Imaging Review No results found.   EKG Interpretation None      MDM   Final diagnoses:  None    Ms. Lenhard is a 23yo F with hx of recurrent skin infections and past IVDU (roughly '2 years ago') who presents with swelling, pain, warmth, and redness of her lower inner forearm that started 3 days ago. She feels pressure when moving her R hand due to the proximity of the abscess. Due to recurrent infections and history of IVDU, suspicion of MRSA is high. We believe that the lesion is not amenable to drainage at this time due to proximity to flexor tendons and nerves in the forearm, and will send her with a course of TMP-SMX and Keflex, with instructions for frequent warm compresses and close follow-up, return in 2 days for re-check or sooner if symptoms worsen.    Darrick Huntsman, MD 06/07/15  1610  Elwin Mocha, MD 06/07/15 1146

## 2015-06-07 NOTE — ED Notes (Signed)
Pt states she has hx of abscess and had abscess appear on her left lower inner forearm x 3 days ago; pt c/o 8/10 on arrival; pt a&ox 4

## 2015-06-08 ENCOUNTER — Emergency Department (HOSPITAL_COMMUNITY)
Admission: EM | Admit: 2015-06-08 | Discharge: 2015-06-08 | Disposition: A | Discharge status: Home / Self Care | Payer: Self-pay | Attending: Emergency Medicine | Admitting: Emergency Medicine

## 2015-06-08 ENCOUNTER — Encounter (HOSPITAL_COMMUNITY): Payer: Self-pay

## 2015-06-08 DIAGNOSIS — L02414 Cutaneous abscess of left upper limb: Secondary | ICD-10-CM

## 2015-06-08 MED ORDER — LIDOCAINE-EPINEPHRINE (PF) 2 %-1:200000 IJ SOLN
20.0000 mL | Freq: Once | INTRAMUSCULAR | Status: AC
  Administered 2015-06-08: 20 mL
  Filled 2015-06-08: qty 20

## 2015-06-08 MED ORDER — TETANUS-DIPHTH-ACELL PERTUSSIS 5-2.5-18.5 LF-MCG/0.5 IM SUSP
0.5000 mL | Freq: Once | INTRAMUSCULAR | Status: AC
  Administered 2015-06-08: 0.5 mL via INTRAMUSCULAR
  Filled 2015-06-08: qty 0.5

## 2015-06-08 NOTE — ED Notes (Signed)
MD at bedside. 

## 2015-06-08 NOTE — ED Provider Notes (Signed)
CSN: [id number]     Arrival date & time 06/07/15  2347 History   This chart was scribed for Azalia Bilis, MD by Evon Slack, ED Scribe. This patient was seen in room A07C/A07C and the patient's care was started at 12:42 AM.      Chief Complaint  Patient presents with  . Abscess   Patient is a 24 y.o. female presenting with abscess. The history is provided by the patient. No language interpreter was used.  Abscess Associated symptoms: no fever, no nausea and no vomiting    HPI Comments: Lorraine Paul is a 24 y.o. female who presents to the Emergency Department complaining of new worsening abscess to left wrist onset 3 days prior. Pt doesn't report any associated symptoms. Pt doesn't report any treatments tried or medications PTA. She states that the area is painful and worse with movement. Pt denies fever, chills, nausea or vomiting.  Patient was seen in the emergency department earlier today and was prescribed antibiotics only without incision and drainage.  She is concerned that this requires incision and drainage and she's having increasing pain.  Her pain is moderate in severity  Past Medical History  Diagnosis Date  . Ovarian cyst   . Skin infection 2016   Past Surgical History  Procedure Laterality Date  . Ovarian cyst removal     No family history on file. History  Substance Use Topics  . Smoking status: Current Every Day Smoker -- 0.25 packs/day    Types: Cigarettes  . Smokeless tobacco: Not on file  . Alcohol Use: Yes     Comment: rarely   OB History    No data available     Review of Systems  Constitutional: Negative for fever and chills.  Gastrointestinal: Negative for nausea and vomiting.  Skin:       Left wrist abscess.   All other systems reviewed and are negative.    Allergies  Tylenol  Home Medications   Prior to Admission medications   Medication Sig Start Date End Date Taking? Authorizing Provider  alprazolam Prudy Feeler) 2 MG tablet Take 1-2 mg by  mouth 3 (three) times daily as needed for anxiety.     Historical Provider, MD  Asenapine Maleate 10 MG SUBL Place 1 each under the tongue daily.    Historical Provider, MD  cephALEXin (KEFLEX) 500 MG capsule Take 1 capsule (500 mg total) by mouth 3 (three) times daily. 06/07/15   Darrick Huntsman, MD  Lurasidone HCl (LATUDA) 20 MG TABS Take 5 mg by mouth daily.    Historical Provider, MD  QUEtiapine (SEROQUEL XR) 400 MG 24 hr tablet Take 400 mg by mouth at bedtime.    Historical Provider, MD  sulfamethoxazole-trimethoprim (BACTRIM DS,SEPTRA DS) 800-160 MG per tablet Take 1 tablet by mouth 2 (two) times daily. 06/07/15   Darrick Huntsman, MD   BP 107/64 mmHg  Pulse 88  Temp(Src) 99 F (37.2 C) (Oral)  Resp 12  Ht 5\' 5"  (1.651 m)  Wt 114 lb 4 oz (51.823 kg)  BMI 19.01 kg/m2  SpO2 100%  LMP  (LMP Unknown)   Physical Exam  Constitutional: She is oriented to person, place, and time. She appears well-developed and well-nourished.  HENT:  Head: Normocephalic.  Eyes: EOM are normal.  Neck: Normal range of motion.  Pulmonary/Chest: Effort normal.  Abdominal: She exhibits no distension.  Musculoskeletal: Normal range of motion.  Neurological: She is alert and oriented to person, place, and time.  Skin:  3.5 cm abscess distal left anterior forearm with smaller amount of surrounding erythema.   Psychiatric: She has a normal mood and affect.  Nursing note and vitals reviewed.   ED Course  Procedures (including critical care time)   INCISION AND DRAINAGE PROCEDURE NOTE: Patient identification was confirmed and verbal consent was obtained. This procedure was performed by Azalia Bilis, MD at 1:18 AM. Site: Distal left forearm Sterile procedures observed: Yes Needle size: 24 gauge  Anesthetic used (type and amt): 2% Lidocaine with Epi Blade size: 14 Drainage: moderate pus Complexity: Complex Packing used: none Site anesthetized, incision made over site, wound drained and  explored loculations, rinsed with copious amounts of normal saline, wound packed with sterile gauze, covered with dry, sterile dressing.  Pt tolerated procedure well without complications.  Instructions for care discussed verbally and pt provided with additional written instructions for homecare and f/u.  DIAGNOSTIC STUDIES: Oxygen Saturation is 100% on RA, normal by my interpretation.    COORDINATION OF CARE: 12:45 AM-Discussed treatment plan with pt at bedside and pt agreed to plan.     Labs Review Labs Reviewed - No data to display  Imaging Review No results found.   EKG Interpretation None      MDM   Final diagnoses:  None      Patient tolerated incision and drainage well.  She is oriented on antibiotics.  She understands to return to the ER for new or worsening symptoms.    I personally performed the services described in this documentation, which was scribed in my presence. The recorded information has been reviewed and is accurate.       Azalia Bilis, MD 06/08/15 [phone]

## 2015-06-08 NOTE — ED Notes (Signed)
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

## 2015-06-08 NOTE — ED Notes (Signed)
Suture cart at bedside 

## 2015-06-08 NOTE — Discharge Instructions (Signed)

## 2015-06-08 NOTE — ED Notes (Signed)
Pt came yesterday but couldn't wait. Here for abscess to inner aspect of left wrist. Has had an abscess there for the past 3 days and it has been getting bigger. Unable to drive and hurts to flex and extend fingers to hand d/t location.

## 2016-04-08 ENCOUNTER — Emergency Department (HOSPITAL_COMMUNITY): Payer: Self-pay | Admitting: Anesthesiology

## 2016-04-08 ENCOUNTER — Encounter (HOSPITAL_COMMUNITY): Payer: Self-pay | Admitting: *Deleted

## 2016-04-08 ENCOUNTER — Observation Stay (HOSPITAL_COMMUNITY)
Admission: EM | Admit: 2016-04-08 | Discharge: 2016-04-09 | Disposition: A | Discharge status: Home / Self Care | Payer: Self-pay | Attending: General Surgery | Admitting: General Surgery

## 2016-04-08 ENCOUNTER — Encounter (HOSPITAL_COMMUNITY)
Admission: EM | Disposition: A | Discharge status: Home / Self Care | Payer: Self-pay | Source: Home / Self Care | Attending: Emergency Medicine

## 2016-04-08 ENCOUNTER — Observation Stay (HOSPITAL_COMMUNITY): Payer: Self-pay

## 2016-04-08 DIAGNOSIS — L02519 Cutaneous abscess of unspecified hand: Secondary | ICD-10-CM | POA: Diagnosis present

## 2016-04-08 DIAGNOSIS — F17210 Nicotine dependence, cigarettes, uncomplicated: Secondary | ICD-10-CM | POA: Insufficient documentation

## 2016-04-08 DIAGNOSIS — L03114 Cellulitis of left upper limb: Principal | ICD-10-CM | POA: Insufficient documentation

## 2016-04-08 DIAGNOSIS — M79602 Pain in left arm: Secondary | ICD-10-CM

## 2016-04-08 DIAGNOSIS — Z886 Allergy status to analgesic agent status: Secondary | ICD-10-CM | POA: Insufficient documentation

## 2016-04-08 DIAGNOSIS — L03119 Cellulitis of unspecified part of limb: Secondary | ICD-10-CM

## 2016-04-08 HISTORY — PX: I & D EXTREMITY: SHX5045

## 2016-04-08 LAB — COMPREHENSIVE METABOLIC PANEL
ALK PHOS: 94 U/L (ref 38–126)
ALT: 20 U/L (ref 14–54)
AST: 36 U/L (ref 15–41)
Albumin: 4.1 g/dL (ref 3.5–5.0)
Anion gap: 12 (ref 5–15)
BUN: 9 mg/dL (ref 6–20)
CALCIUM: 9.6 mg/dL (ref 8.9–10.3)
CO2: 23 mmol/L (ref 22–32)
Chloride: 105 mmol/L (ref 101–111)
Creatinine, Ser: 0.67 mg/dL (ref 0.44–1.00)
GFR calc non Af Amer: >60 mL/min (ref >60)
Glucose, Bld: 97 mg/dL (ref 65–99)
POTASSIUM: 3.8 mmol/L (ref 3.5–5.1)
SODIUM: 140 mmol/L (ref 135–145)
Total Bilirubin: 1.3 mg/dL — ABNORMAL HIGH (ref 0.3–1.2)
Total Protein: 7.3 g/dL (ref 6.5–8.1)

## 2016-04-08 LAB — CBC WITH DIFFERENTIAL/PLATELET
BASOS PCT: 0 %
Basophils Absolute: 0 10*3/uL (ref 0.0–0.1)
EOS ABS: 0 10*3/uL (ref 0.0–0.7)
Eosinophils Relative: 0 %
HCT: 38.2 % (ref 36.0–46.0)
HEMOGLOBIN: 12.6 g/dL (ref 12.0–15.0)
Lymphocytes Relative: 14 %
Lymphs Abs: 1.9 10*3/uL (ref 0.7–4.0)
MCH: 26.6 pg (ref 26.0–34.0)
MCHC: 33 g/dL (ref 30.0–36.0)
MCV: 80.8 fL (ref 78.0–100.0)
Monocytes Absolute: 0.9 10*3/uL (ref 0.1–1.0)
Monocytes Relative: 7 %
NEUTROS PCT: 79 %
Neutro Abs: 10.2 10*3/uL — ABNORMAL HIGH (ref 1.7–7.7)
Platelets: 164 10*3/uL (ref 150–400)
RBC: 4.73 MIL/uL (ref 3.87–5.11)
RDW: 14.5 % (ref 11.5–15.5)
WBC: 13.1 10*3/uL — AB (ref 4.0–10.5)

## 2016-04-08 SURGERY — IRRIGATION AND DEBRIDEMENT EXTREMITY
Anesthesia: General | Site: Hand | Laterality: Left

## 2016-04-08 MED ORDER — LACTATED RINGERS IV SOLN
INTRAVENOUS | Status: DC | PRN
  Administered 2016-04-08: 18:00:00 via INTRAVENOUS

## 2016-04-08 MED ORDER — GADOBENATE DIMEGLUMINE 529 MG/ML IV SOLN
15.0000 mL | Freq: Once | INTRAVENOUS | Status: AC | PRN
  Administered 2016-04-08: 14 mL via INTRAVENOUS

## 2016-04-08 MED ORDER — MEPERIDINE HCL 25 MG/ML IJ SOLN: 6.2500 mg | INTRAMUSCULAR | Status: DC | PRN

## 2016-04-08 MED ORDER — OXYCODONE HCL 5 MG PO TABS
5.0000 mg | ORAL_TABLET | Freq: Once | ORAL | Status: AC | PRN
  Administered 2016-04-08: 5 mg via ORAL

## 2016-04-08 MED ORDER — OXYCODONE HCL 5 MG/5ML PO SOLN: 5.0000 mg | Freq: Once | ORAL | Status: AC | PRN

## 2016-04-08 MED ORDER — SODIUM CHLORIDE 0.9 % IV SOLN
3.0000 g | Freq: Three times a day (TID) | INTRAVENOUS | Status: DC
  Administered 2016-04-08 – 2016-04-09 (×4): 3 g via INTRAVENOUS
  Filled 2016-04-08 (×5): qty 3

## 2016-04-08 MED ORDER — PROPOFOL 10 MG/ML IV BOLUS
INTRAVENOUS | Status: AC
  Filled 2016-04-08: qty 20

## 2016-04-08 MED ORDER — ALPRAZOLAM 0.5 MG PO TABS
0.5000 mg | ORAL_TABLET | Freq: Four times a day (QID) | ORAL | Status: DC | PRN
  Administered 2016-04-08 – 2016-04-09 (×3): 0.5 mg via ORAL
  Filled 2016-04-08 (×3): qty 1

## 2016-04-08 MED ORDER — BUPIVACAINE HCL (PF) 0.25 % IJ SOLN
INTRAMUSCULAR | Status: AC
  Filled 2016-04-08: qty 30

## 2016-04-08 MED ORDER — PROPOFOL 10 MG/ML IV BOLUS
INTRAVENOUS | Status: DC | PRN
  Administered 2016-04-08: 200 mg via INTRAVENOUS

## 2016-04-08 MED ORDER — OXYCODONE HCL 5 MG PO TABS
5.0000 mg | ORAL_TABLET | ORAL | Status: DC | PRN
  Administered 2016-04-08: 10 mg via ORAL
  Administered 2016-04-09: 5 mg via ORAL
  Administered 2016-04-09 (×2): 10 mg via ORAL
  Filled 2016-04-08 (×5): qty 2

## 2016-04-08 MED ORDER — CEFAZOLIN SODIUM-DEXTROSE 2-3 GM-% IV SOLR
INTRAVENOUS | Status: DC | PRN
  Administered 2016-04-08: 2 g via INTRAVENOUS

## 2016-04-08 MED ORDER — HYDROMORPHONE HCL 1 MG/ML IJ SOLN
INTRAMUSCULAR | Status: AC
  Filled 2016-04-08: qty 1

## 2016-04-08 MED ORDER — BUPIVACAINE HCL (PF) 0.25 % IJ SOLN
INTRAMUSCULAR | Status: DC | PRN
  Administered 2016-04-08: 10 mL

## 2016-04-08 MED ORDER — MORPHINE SULFATE (PF) 4 MG/ML IV SOLN
4.0000 mg | Freq: Once | INTRAVENOUS | Status: AC
  Administered 2016-04-08: 4 mg via INTRAVENOUS
  Filled 2016-04-08: qty 1

## 2016-04-08 MED ORDER — SODIUM CHLORIDE 0.9 % IR SOLN
Status: DC | PRN
  Administered 2016-04-08: 1

## 2016-04-08 MED ORDER — VANCOMYCIN HCL IN DEXTROSE 1-5 GM/200ML-% IV SOLN
1000.0000 mg | Freq: Once | INTRAVENOUS | Status: AC
  Administered 2016-04-08: 1000 mg via INTRAVENOUS
  Filled 2016-04-08: qty 200

## 2016-04-08 MED ORDER — CEFTRIAXONE SODIUM 1 G IJ SOLR
1.0000 g | Freq: Once | INTRAMUSCULAR | Status: AC
  Administered 2016-04-08: 1 g via INTRAVENOUS
  Filled 2016-04-08: qty 10

## 2016-04-08 MED ORDER — HYDROMORPHONE HCL 1 MG/ML IJ SOLN: 0.2500 mg | INTRAMUSCULAR | Status: DC | PRN

## 2016-04-08 MED ORDER — FENTANYL CITRATE (PF) 250 MCG/5ML IJ SOLN
INTRAMUSCULAR | Status: AC
  Filled 2016-04-08: qty 5

## 2016-04-08 MED ORDER — ALPRAZOLAM 0.25 MG PO TABS
1.0000 mg | ORAL_TABLET | Freq: Once | ORAL | Status: AC
  Administered 2016-04-08: 1 mg via ORAL
  Filled 2016-04-08: qty 4

## 2016-04-08 MED ORDER — LORAZEPAM 2 MG/ML IJ SOLN: 1.0000 mg | Freq: Once | INTRAMUSCULAR | Status: DC | PRN

## 2016-04-08 MED ORDER — HYDROMORPHONE HCL 1 MG/ML IJ SOLN
0.2500 mg | INTRAMUSCULAR | Status: DC | PRN
  Administered 2016-04-08 (×4): 0.5 mg via INTRAVENOUS

## 2016-04-08 MED ORDER — FENTANYL CITRATE (PF) 100 MCG/2ML IJ SOLN
INTRAMUSCULAR | Status: DC | PRN
  Administered 2016-04-08: 50 ug via INTRAVENOUS

## 2016-04-08 MED ORDER — PROMETHAZINE HCL 25 MG/ML IJ SOLN: 6.2500 mg | INTRAMUSCULAR | Status: DC | PRN

## 2016-04-08 MED ORDER — OXYCODONE HCL 5 MG PO TABS
ORAL_TABLET | ORAL | Status: AC
  Filled 2016-04-08: qty 1

## 2016-04-08 MED ORDER — ACETAMINOPHEN 160 MG/5ML PO SOLN
325.0000 mg | ORAL | Status: DC | PRN
Start: 2016-04-08 — End: 2016-04-08

## 2016-04-08 MED ORDER — VANCOMYCIN HCL IN DEXTROSE 750-5 MG/150ML-% IV SOLN
750.0000 mg | Freq: Two times a day (BID) | INTRAVENOUS | Status: DC
  Administered 2016-04-09 (×2): 750 mg via INTRAVENOUS
  Filled 2016-04-08 (×4): qty 150

## 2016-04-08 MED ORDER — LIDOCAINE HCL (CARDIAC) 20 MG/ML IV SOLN
INTRAVENOUS | Status: DC | PRN
  Administered 2016-04-08: 40 mg via INTRATRACHEAL

## 2016-04-08 MED ORDER — HYDROMORPHONE HCL 1 MG/ML IJ SOLN
0.5000 mg | Freq: Once | INTRAMUSCULAR | Status: AC
  Administered 2016-04-08: 0.5 mg via INTRAVENOUS
  Filled 2016-04-08: qty 1

## 2016-04-08 MED ORDER — ACETAMINOPHEN 325 MG PO TABS
325.0000 mg | ORAL_TABLET | ORAL | Status: DC | PRN
Start: 2016-04-08 — End: 2016-04-08

## 2016-04-08 MED ORDER — LACTATED RINGERS IV SOLN: INTRAVENOUS | Status: DC

## 2016-04-08 SURGICAL SUPPLY — 33 items
BNDG GAUZE ELAST 4 BULKY (GAUZE/BANDAGES/DRESSINGS) ×2 IMPLANT
ELECT REM PT RETURN 9FT ADLT (ELECTROSURGICAL)
ELECTRODE REM PT RTRN 9FT ADLT (ELECTROSURGICAL) IMPLANT
GAUZE PACKING IODOFORM 1/4X5 (PACKING) IMPLANT
GAUZE SPONGE 4X4 12PLY STRL (GAUZE/BANDAGES/DRESSINGS) ×5 IMPLANT
GAUZE XEROFORM 1X8 LF (GAUZE/BANDAGES/DRESSINGS) ×3 IMPLANT
GLOVE BIOGEL M 8.0 STRL (GLOVE) ×3 IMPLANT
GOWN STRL REUS W/ TWL LRG LVL3 (GOWN DISPOSABLE) ×2 IMPLANT
GOWN STRL REUS W/TWL LRG LVL3 (GOWN DISPOSABLE) ×6
HANDPIECE INTERPULSE COAX TIP (DISPOSABLE)
KIT BASIN OR (CUSTOM PROCEDURE TRAY) ×3 IMPLANT
KIT ROOM TURNOVER OR (KITS) ×3 IMPLANT
MANIFOLD NEPTUNE II (INSTRUMENTS) ×3 IMPLANT
NDL HYPO 25GX1X1/2 BEV (NEEDLE) IMPLANT
NEEDLE HYPO 25GX1X1/2 BEV (NEEDLE) IMPLANT
NS IRRIG 1000ML POUR BTL (IV SOLUTION) ×3 IMPLANT
PACK ORTHO EXTREMITY (CUSTOM PROCEDURE TRAY) ×3 IMPLANT
PAD ARMBOARD 7.5X6 YLW CONV (MISCELLANEOUS) ×4 IMPLANT
PAD CAST 4YDX4 CTTN HI CHSV (CAST SUPPLIES) IMPLANT
PADDING CAST COTTON 4X4 STRL (CAST SUPPLIES)
SET HNDPC FAN SPRY TIP SCT (DISPOSABLE) IMPLANT
SOAP 2 % CHG 4 OZ (WOUND CARE) ×3 IMPLANT
SPONGE LAP 18X18 X RAY DECT (DISPOSABLE) IMPLANT
SPONGE LAP 4X18 X RAY DECT (DISPOSABLE) ×3 IMPLANT
SUT ETHILON 5 0 PS 2 18 (SUTURE) ×2 IMPLANT
SYR CONTROL 10ML LL (SYRINGE) IMPLANT
TOWEL OR 17X24 6PK STRL BLUE (TOWEL DISPOSABLE) ×3 IMPLANT
TOWEL OR 17X26 10 PK STRL BLUE (TOWEL DISPOSABLE) ×3 IMPLANT
TUBE ANAEROBIC SPECIMEN COL (MISCELLANEOUS) ×2 IMPLANT
TUBE CONNECTING 12'X1/4 (SUCTIONS) ×1
TUBE CONNECTING 12X1/4 (SUCTIONS) ×2 IMPLANT
WATER STERILE IRR 1000ML POUR (IV SOLUTION) ×3 IMPLANT
YANKAUER SUCT BULB TIP NO VENT (SUCTIONS) ×3 IMPLANT

## 2016-04-08 NOTE — Anesthesia Postprocedure Evaluation (Signed)
Anesthesia Post Note  Patient: Lorraine MccallumHeather Paul  Procedure(s) Performed: Procedure(s) (LRB): IRRIGATION AND DEBRIDEMENT OF HAND (Left)  Patient location during evaluation: PACU Anesthesia Type: General Level of consciousness: awake and alert Pain management: pain level controlled Vital Signs Assessment: post-procedure vital signs reviewed and stable Respiratory status: spontaneous breathing Cardiovascular status: stable Postop Assessment: no signs of nausea or vomiting Anesthetic complications: no    Last Vitals:  Filed Vitals:   04/08/16 2000 04/08/16 2016  BP: 145/76 153/79  Pulse: 38 41  Temp:  36.6 C  Resp: 15 17    Last Pain:  Filed Vitals:   04/08/16 2025  PainSc: 6                  Caellum Mancil

## 2016-04-08 NOTE — ED Notes (Signed)
IV team at bedside 

## 2016-04-08 NOTE — ED Notes (Signed)
Attempted x2 to insert IV in foot per Dr. Vivia EwingNanvati. Unable to gain IV access.

## 2016-04-08 NOTE — Consult Note (Signed)
Pharmacy Antibiotic Note  Lorraine MccallumHeather Paul is a 25 y.o. female admitted on 04/08/2016 with left hand cellulitis, s/p I&D this evening. MRI pending. Pharmacy has been consulted for vancomycin and unasyn dosing. Renal function wnl.  Goal: vancomycin trough 10-15  Plan: 1) Vancomycin 1000mg  IV x 1 then 750mg  IV q12 2) Unasyn 3g IV q8 3) Follow renal function, cultures, LOT, level if needed  Height: 5' 4.96" (165 cm) Weight: 114 lb 10.2 oz (52 kg) IBW/kg (Calculated) : 56.91  Temp (24hrs), Avg:98.4 F (36.9 C), Min:98.4 F (36.9 C), Max:98.4 F (36.9 C)   Recent Labs Lab 04/08/16 1451  WBC 13.1*  CREATININE 0.67    Estimated Creatinine Clearance: 89 mL/min (by C-G formula based on Cr of 0.67).    Allergies  Allergen Reactions  . Tylenol [Acetaminophen]     nausea    Antimicrobials this admission: 6/1 Vancomycin >> 6/1 Unasyn >>  Dose adjustments this admission: n/a  Microbiology results: n/a  Thank you for allowing pharmacy to be a part of this patient's care.  Fredrik RiggerMarkle, Lorraine Paul 04/08/2016 7:27 PM

## 2016-04-08 NOTE — ED Notes (Signed)
Pt arrives from home via [facility]. Pt states she noticed some discoloration and mild swelling to the right arm yesterday which worsened overnight into today. Pt right arm is very swollen, tender to touch and red/bruised. 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 Pt received 150 mcg of fentanyl en route.

## 2016-04-08 NOTE — Anesthesia Procedure Notes (Signed)
Procedure Name: LMA Insertion Date/Time: 04/08/2016 6:40 PM Performed by: Gwenyth AllegraADAMI, [name] Pre-anesthesia Checklist: Patient identified, Timeout performed, Emergency Drugs available, Suction available and Patient being monitored Patient Re-evaluated:Patient Re-evaluated prior to inductionOxygen Delivery Method: Circle system utilized Preoxygenation: Pre-oxygenation with 100% oxygen Intubation Type: IV induction Ventilation: Mask ventilation without difficulty LMA: LMA inserted LMA Size: 4.0 Number of attempts: 1 Placement Confirmation: positive ETCO2 and breath sounds checked- equal and bilateral Tube secured with: Tape Dental Injury: Teeth and Oropharynx as per pre-operative assessment

## 2016-04-08 NOTE — Consult Note (Signed)
Reason for Consult:infection Referring Physician: ER  CC:My armhurts  HPI:  Lorraine Paul is an 25 y.o. 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        .   Pain is rated at 10   /10 and is described as sharp.  Pain is constant.  Pain is made better by rest/immobilization, worse with motion.   Associated signs/symptoms:denies fever Previous treatment:  Denies previous injury  Past Medical History  Diagnosis Date  . Ovarian cyst   . Skin infection 2016    Past Surgical History  Procedure Laterality Date  . Ovarian cyst removal      History reviewed. No pertinent family history.  Social History:  reports that she has been smoking Cigarettes.  She has been smoking about 0.25 packs per day. She does not have any smokeless tobacco history on file. She reports that she drinks alcohol. She reports that she does not use illicit drugs.  Allergies:  Allergies  Allergen Reactions  . Tylenol [Acetaminophen]     nausea    Medications: I have reviewed the patient's current medications.  Results for orders placed or performed during the hospital encounter of 04/08/16 (from the past 48 hour(s))  CBC with Differential     Status: Abnormal   Collection Time: 04/08/16  2:51 PM  Result Value Ref Range   WBC 13.1 (H) 4.0 - 10.5 K/uL   RBC 4.73 3.87 - 5.11 MIL/uL   Hemoglobin 12.6 12.0 - 15.0 g/dL   HCT 38.2 36.0 - 46.0 %   MCV 80.8 78.0 - 100.0 fL   MCH 26.6 26.0 - 34.0 pg   MCHC 33.0 30.0 - 36.0 g/dL   RDW 14.5 11.5 - 15.5 %   Platelets 164 150 - 400 K/uL   Neutrophils Relative % 79 %   Neutro Abs 10.2 (H) 1.7 - 7.7 K/uL   Lymphocytes Relative 14 %   Lymphs Abs 1.9 0.7 - 4.0 K/uL   Monocytes Relative 7 %   Monocytes Absolute 0.9 0.1 - 1.0 K/uL   Eosinophils Relative 0 %   Eosinophils Absolute 0.0 0.0 - 0.7 K/uL   Basophils Relative  0 %   Basophils Absolute 0.0 0.0 - 0.1 K/uL  Comprehensive metabolic panel     Status: Abnormal   Collection Time: 04/08/16  2:51 PM  Result Value Ref Range   Sodium 140 135 - 145 mmol/L   Potassium 3.8 3.5 - 5.1 mmol/L   Chloride 105 101 - 111 mmol/L   CO2 23 22 - 32 mmol/L   Glucose, Bld 97 65 - 99 mg/dL   BUN 9 6 - 20 mg/dL   Creatinine, Ser 0.67 0.44 - 1.00 mg/dL   Calcium 9.6 8.9 - 10.3 mg/dL   Total Protein 7.3 6.5 - 8.1 g/dL   Albumin 4.1 3.5 - 5.0 g/dL   AST 36 15 - 41 U/L   ALT 20 14 - 54 U/L   Alkaline Phosphatase 94 38 - 126 U/L   Total Bilirubin 1.3 (H) 0.3 - 1.2 mg/dL   GFR calc non Af Amer >60 >60 mL/min   GFR calc Af Amer >60 >60 mL/min    Comment: (NOTE) The eGFR has been calculated using the CKD EPI equation. This calculation has not been validated in all clinical situations. eGFR's persistently <60 mL/min signify possible Chronic Kidney  Disease.    Anion gap 12 5 - 15    No results found.  Pertinent items are noted in HPI. Temp:  [98.4 F (36.9 C)] 98.4 F (36.9 C) (06/01 1356) Pulse Rate:  [38-83] 46 (06/01 1615) Resp:  [18-23] 19 (06/01 1615) BP: (106-138)/(66-90) 125/77 mmHg (06/01 1615) SpO2:  [100 %] 100 % (06/01 1615) General appearance: alert and cooperative Resp: clear to auscultation bilaterally Cardio: regular rate and rhythm GI: soft, non-tender; bowel sounds normal; no masses,  no organomegaly Extremities: R upper extremity wnl, LUE diffuse swelling erythema, warmth, very tender around thumb   Assessment: L arm cellulitis, probable deep infection around thumb, thenar area Plan: Recommend I&D tonight I have discussed this treatment plan in detail with patient, including the risks of the recommended treatment or surgery, the benefits and the alternatives.  The patient understands that additional treatment may be necessary. Currently the patient does not want to stay in the hospital.  Edna Bay 04/08/2016, 5:23 PM

## 2016-04-08 NOTE — Anesthesia Preprocedure Evaluation (Addendum)
Anesthesia Evaluation  Patient identified by MRN, date of birth, ID band Patient awake    Reviewed: Allergy & Precautions, NPO status , Patient's Chart, lab work & pertinent test results  History of Anesthesia Complications Negative for: history of anesthetic complications  Airway Mallampati: II  TM Distance: >3 FB Neck ROM: Full    Dental  (+) Teeth Intact   Pulmonary neg shortness of breath, neg sleep apnea, neg COPD, neg recent URI, Current Smoker,    breath sounds clear to auscultation       Cardiovascular negative cardio ROS   Rhythm:Regular     Neuro/Psych negative neurological ROS  negative psych ROS   GI/Hepatic negative GI ROS, Neg liver ROS,   Endo/Other  negative endocrine ROS  Renal/GU negative Renal ROS  negative genitourinary   Musculoskeletal negative musculoskeletal ROS (+)   Abdominal   Peds negative pediatric ROS (+)  Hematology negative hematology ROS (+)   Anesthesia Other Findings   Reproductive/Obstetrics negative OB ROS                            Anesthesia Physical Anesthesia Plan  ASA: II  Anesthesia Plan: General   Post-op Pain Management:    Induction: Intravenous, Rapid sequence and Cricoid pressure planned  Airway Management Planned: Oral ETT and LMA  Additional Equipment: None  Intra-op Plan:   Post-operative Plan: Extubation in OR  Informed Consent: I have reviewed the patients History and Physical, chart, labs and discussed the procedure including the risks, benefits and alternatives for the proposed anesthesia with the patient or authorized representative who has indicated his/her understanding and acceptance.   Dental advisory given  Plan Discussed with: CRNA and Surgeon  Anesthesia Plan Comments:        Anesthesia Quick Evaluation

## 2016-04-08 NOTE — Transfer of Care (Signed)
3Immediate Anesthesia Transfer of Care Note  Patient: Payton MccallumHeather Hueber  Procedure(s) Performed: Procedure(s): IRRIGATION AND DEBRIDEMENT OF HAND (Left)  Patient Location: PACU  Anesthesia Type:General  Level of Consciousness: awake, alert  and oriented  Airway & Oxygen Therapy: Patient Spontanous Breathing and Patient connected to nasal cannula oxygen  Post-op Assessment: Report given to RN and Post -op Vital signs reviewed and stable  Post vital signs: Reviewed and stable  Last Vitals:  Filed Vitals:   04/08/16 1615 04/08/16 1930  BP: 125/77   Pulse: 46   Temp:  36.6 C  Resp: 19     Last Pain:  Filed Vitals:   04/08/16 1932  PainSc: Asleep         Complications: No apparent anesthesia complications

## 2016-04-08 NOTE — ED Notes (Signed)
Report attempted to anesthesia. Will call back.
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
## 2016-04-08 NOTE — Progress Notes (Signed)
Orthopedic Tech Progress Note Patient Details:  Payton MccallumHeather Haffey [birth year] [id number]  Ortho Devices Type of Ortho Device: Arm sling Ortho Device/Splint Location: LUE Ortho Device/Splint Interventions: Ordered, Application   Jennye MoccasinHughes, Makynlee Kressin Craig 04/08/2016, 9:29 PM

## 2016-04-08 NOTE — ED Notes (Signed)
Hand surgeon at bedside. 

## 2016-04-08 NOTE — ED Notes (Signed)
Pt eloped from room and was found outside by case worker. Pt brought back in placed in bed. Pt requests to have monitor unhooked from updated that a hand surgeon was coming to see her.

## 2016-04-09 ENCOUNTER — Encounter (HOSPITAL_COMMUNITY): Payer: Self-pay | Admitting: General Surgery

## 2016-04-09 LAB — CBC
HEMATOCRIT: 36.9 % (ref 36.0–46.0)
HEMOGLOBIN: 11.5 g/dL — AB (ref 12.0–15.0)
MCH: 26.3 pg (ref 26.0–34.0)
MCHC: 31.2 g/dL (ref 30.0–36.0)
MCV: 84.2 fL (ref 78.0–100.0)
Platelets: 170 10*3/uL (ref 150–400)
RBC: 4.38 MIL/uL (ref 3.87–5.11)
RDW: 14.7 % (ref 11.5–15.5)
WBC: 12.7 10*3/uL — ABNORMAL HIGH (ref 4.0–10.5)

## 2016-04-09 MED ORDER — IBUPROFEN 600 MG PO TABS
600.0000 mg | ORAL_TABLET | Freq: Three times a day (TID) | ORAL | Status: DC
  Administered 2016-04-09 (×2): 600 mg via ORAL
  Filled 2016-04-09 (×2): qty 1

## 2016-04-09 MED ORDER — HYDROMORPHONE HCL 2 MG PO TABS: 1.0000 mg | ORAL_TABLET | ORAL | Status: DC | PRN

## 2016-04-09 MED ORDER — HYDROMORPHONE HCL 1 MG/ML IJ SOLN
1.0000 mg | INTRAMUSCULAR | Status: DC | PRN
  Administered 2016-04-09 (×4): 1 mg via INTRAVENOUS
  Filled 2016-04-09 (×5): qty 1

## 2016-04-09 NOTE — Discharge Instructions (Signed)
Wash wounds with soap and water, place a small amount of antibiotic ointment over them and keep covered, Elevate L hand

## 2016-04-09 NOTE — Progress Notes (Signed)
Patients room smelled like cigarette smoke when I walked in to administer her medications. Patient denied smoking but said it was probably her visitor that stopped by earlier. I told patient that we have a strictly non smoking policy in hospital and that we do not allow smoking in rooms. I stated to her that he would be informed about the policy also if he was to come back and visit.

## 2016-04-09 NOTE — Progress Notes (Signed)
S: hand hurts but feels better  O:Blood pressure 118/64, pulse 53, temperature 99.3 F (37.4 C), temperature source Oral, resp. rate 17, height 5' 4.96" (1.65 m), weight 52 kg (114 lb 10.2 oz), SpO2 97 %.  Dressing changed, better appearance, swelling of forearm, able to move fingers, thumb and wrist without too much discomfort, dorsal hand still edematous, all incision sites look good MRI with no definite abscess, diffuse edema  A:s/p multiple I&D attempts for cellulitis   P: wound care discussed, elevation, nwb L hand, gently move fingers, pt wants to go home this afternoon, will look at timing of IV abx, pt states she can get and start oral abx today if d/c'd

## 2016-04-09 NOTE — Discharge Planning (Signed)
Patient has copy of AVS and rx from MD. Dc'd amb to private car home with all personal belongings accompanied by family at [id number].

## 2016-04-09 NOTE — Op Note (Signed)
NAMPayton Paul:  Pro, Kristyna               ACCOUNT NO.:  [id number]  MEDICAL RECORD NO.:  [id number]  LOCATION:  [id number]                        FACILITY:  [facility]  PHYSICIAN:  Johnette AbrahamHarrill C Lorali Khamis, MD    DATE OF BIRTH:  [birth year]  DATE OF PROCEDURE:  04/08/2016 DATE OF DISCHARGE:                              OPERATIVE REPORT   PREOPERATIVE DIAGNOSIS:  Swelling, cellulitis, possible abscess of the left hand.  POSTOPERATIVE DIAGNOSIS:  Swelling, cellulitis, possible abscess of the left hand.  PROCEDURE: 1. Multiple I and D's of the left hand and forearm. 2. Radiocarpal wrist joint aspiration.  INDICATIONS:  [name] is a 25 year old female, who presented to the emergency room today with a 1-day history of severe pain, swelling of the left upper extremity.  She denies trauma.  She denies IV drug use. She states this began just overnight.  On evaluation, she had most of her tenderness around her thumb with swelling, warmth, erythema up to the mid-to-proximal forearm.  Suspicion was that she had a deep abscess in the thenar region with cellulitis up the arm.  Risks, benefits, and alternatives of I and D were discussed with the patient.  She agreed with this course of action and consent was obtained for surgery.  DESCRIPTION OF PROCEDURE:  The patient was taken to the operating room, placed supine on the operating table.  The left upper extremity was prepped and draped in normal sterile fashion after anesthesia was administered.  The extremity was evaluated and the patient was again most tender on the thenar aspect of the thumb, however this area felt very soft.  There did feel to be some dense tissue on the almost along the first webspace.  A small puncture incision was made over the webspace and a small cryo was used to probe deep into the tissues. There was no purulence found.  An alternate small incision was made on the mid lateral line over the thenar eminence.  Again, probing of this area  did not reveal any purulence.  There were several other small incisions that were made, one over the extensor tendon sheath dorsally, again without evidence of purulent drainage.  Up the forearm, both on the volar and dorsal aspect, again small incisions were made and probing of the wound did not reveal any abscess pockets.  Next, the wrist joint was accessed with a 21-gauge Angiocath needle.  A small amount of what appeared to be normal joint fluid was aspirated.  This was sent for culture.  There did not appear to be any additional purulence.  After thorough cleansing the arm again, induration and abscess cavity was sought and no additional I and D site was felt needed.  The small wounds were loosely approximated and sterile dressing was applied.  She was awakened and taken to the recovery room in stable condition.  Because of the lack of deep abscess causing her symptoms, I will be ordering an MRI to look for some deep underlying infection or abscess cavity that was missed.     Johnette AbrahamHarrill C Amylynn Fano, MD     HCC/MEDQ  D:  04/08/2016  T:  04/09/2016  Job:  [id number]

## 2016-04-11 LAB — AEROBIC CULTURE W GRAM STAIN (SUPERFICIAL SPECIMEN)

## 2016-04-11 LAB — AEROBIC CULTURE  (SUPERFICIAL SPECIMEN): CULTURE: NO GROWTH

## 2016-04-13 NOTE — Discharge Summary (Signed)
[name], Lorraine               ACCOUNT NO.:  [id number]  MEDICAL RECORD NO.:  [id number]  LOCATION:  [id number]                        FACILITY:  [facility]  PHYSICIAN:  Johnette AbrahamHarrill C Jesstin Studstill, MD    DATE OF BIRTH:  12-15-1990  DATE OF ADMISSION:  04/08/2016 DATE OF DISCHARGE:  04/09/2016                              DISCHARGE SUMMARY   ADMITTING DIAGNOSIS:  Cellulitis of the left hand and arm.  DISCHARGE DIAGNOSIS:  Cellulitis of the left hand and arm.  PROCEDURES DURING THIS ADMISSION:  The patient was taken to the operating room for multiple incisions of the left upper extremity. Please see the operative note for details.  HOSPITAL COURSE:  The patient was seen in the emergency room, felt to have abscess formation and cellulitis of the left arm.  She was taken to the operating room that evening.  Multiple attempts were made at draining the deep abscess; however, none were encountered. Subsequently, she underwent an MRI to look for an abscess that was not located.  The MRI showed just diffuse inflammatory changes without any definite abscess.  The patient was placed on broad-spectrum antibiotics; vancomycin and Unasyn.  On postoperative day 1, when I saw her, she had significant improvement in her forearm swelling and erythema.  She still had a moderate amount of edema to the dorsal aspect of her hand.  All of her small I and D sites looked well.  The patient was adamant about wanting to go home.  I placed her on oral antibiotics after she had received the evening dose of both her vancomycin and Unasyn of Keflex and Bactrim and allowed her to be discharged with the assurance that if her hand became worse, she would call or come back to the emergency room.  Otherwise, I will see her next week for a wound check.     Johnette AbrahamHarrill C Leen Tworek, MD     HCC/MEDQ  D:  04/12/2016  T:  04/13/2016  Job:  [id number]

## 2016-06-05 ENCOUNTER — Encounter (HOSPITAL_COMMUNITY): Payer: Self-pay | Admitting: Emergency Medicine

## 2016-06-05 ENCOUNTER — Emergency Department (HOSPITAL_COMMUNITY)
Admission: EM | Admit: 2016-06-05 | Discharge: 2016-06-05 | Disposition: A | Discharge status: Home / Self Care | Payer: Self-pay | Attending: Dermatology | Admitting: Dermatology

## 2016-06-05 ENCOUNTER — Ambulatory Visit (HOSPITAL_COMMUNITY)
Admission: EM | Admit: 2016-06-05 | Discharge: 2016-06-05 | Disposition: A | Discharge status: Home / Self Care | Payer: Self-pay | Attending: Family Medicine | Admitting: Family Medicine

## 2016-06-05 ENCOUNTER — Encounter (HOSPITAL_COMMUNITY): Payer: Self-pay

## 2016-06-05 DIAGNOSIS — Y939 Activity, unspecified: Secondary | ICD-10-CM | POA: Insufficient documentation

## 2016-06-05 DIAGNOSIS — X58XXXA Exposure to other specified factors, initial encounter: Secondary | ICD-10-CM | POA: Insufficient documentation

## 2016-06-05 DIAGNOSIS — B9689 Other specified bacterial agents as the cause of diseases classified elsewhere: Secondary | ICD-10-CM

## 2016-06-05 DIAGNOSIS — N898 Other specified noninflammatory disorders of vagina: Secondary | ICD-10-CM | POA: Insufficient documentation

## 2016-06-05 DIAGNOSIS — Z5321 Procedure and treatment not carried out due to patient leaving prior to being seen by health care provider: Secondary | ICD-10-CM | POA: Insufficient documentation

## 2016-06-05 DIAGNOSIS — A499 Bacterial infection, unspecified: Secondary | ICD-10-CM

## 2016-06-05 DIAGNOSIS — F17210 Nicotine dependence, cigarettes, uncomplicated: Secondary | ICD-10-CM | POA: Insufficient documentation

## 2016-06-05 DIAGNOSIS — N760 Acute vaginitis: Secondary | ICD-10-CM | POA: Insufficient documentation

## 2016-06-05 DIAGNOSIS — T192XXA Foreign body in vulva and vagina, initial encounter: Secondary | ICD-10-CM | POA: Insufficient documentation

## 2016-06-05 MED ORDER — METRONIDAZOLE 500 MG PO TABS: 500.0000 mg | ORAL_TABLET | Freq: Two times a day (BID) | ORAL | 0 refills | Status: DC

## 2016-06-05 NOTE — ED Notes (Signed)
Pt states she is going to [facility].

## 2016-06-05 NOTE — ED Triage Notes (Signed)
Patient here with right flank pain and dysuria x 1 week. Also complains of vaginal discharge and odor x 1 month. Also reports when she went to have BM last night noticed blood-unsure if she has hemmorrhoid

## 2016-06-05 NOTE — ED Provider Notes (Signed)
CSN: [id number]     Arrival date & time 06/05/16  1306 History   None    No chief complaint on file.  (Consider location/radiation/quality/duration/timing/severity/associated sxs/prior Treatment) The history is provided by the patient.  Vaginal Discharge  Quality:  Clear, white, watery and malodorous Severity:  Moderate Onset quality:  Gradual Duration:  1 week Timing:  Constant Progression:  Worsening Chronicity:  New Context: spontaneously   Relieved by:  Nothing Worsened by:  Nothing Associated symptoms: no abdominal pain, no dyspareunia, no dysuria, no fever, no genital lesions, no nausea, no rash, no urinary frequency, no urinary hesitancy, no urinary incontinence, no vaginal itching and no vomiting   Risk factors: no endometriosis, no foreign body, no gynecological surgery, no immunosuppression, no new sexual partner, no PID, no prior miscarriage, no STI, no STI exposure, no terminated pregnancy and no unprotected sex     Past Medical History:  Diagnosis Date  . Ovarian cyst   . Skin infection 2016   Past Surgical History:  Procedure Laterality Date  . I&D EXTREMITY Left 04/08/2016   Procedure: IRRIGATION AND DEBRIDEMENT OF HAND;  Surgeon: Knute Neu, MD;  Location: [facility] OR;  Service: Plastics;  Laterality: Left;  . OVARIAN CYST REMOVAL     No family history on file. Social History  Substance Use Topics  . Smoking status: Current Every Day Smoker    Packs/day: 0.25    Types: Cigarettes  . Smokeless tobacco: Never Used  . Alcohol use Yes     Comment: rarely   OB History    No data available     Review of Systems  Constitutional: Negative for fever.  Gastrointestinal: Negative for abdominal pain, nausea and vomiting.  Genitourinary: Positive for vaginal discharge. Negative for bladder incontinence, dyspareunia, dysuria and hesitancy.  All other systems reviewed and are negative.   Allergies  Tylenol [acetaminophen]  Home Medications   Prior to Admission  medications   Medication Sig Start Date End Date Taking? Authorizing Provider  alprazolam Prudy Feeler) 2 MG tablet Take 1-2 mg by mouth 3 (three) times daily as needed for anxiety.     Historical Provider, MD  Asenapine Maleate 10 MG SUBL Place 1 each under the tongue daily.    Historical Provider, MD  metroNIDAZOLE (FLAGYL) 500 MG tablet Take 1 tablet (500 mg total) by mouth 2 (two) times daily. 06/05/16   Roma Kayser Francy Mcilvaine, NP   Meds Ordered and Administered this Visit  Medications - No data to display  There were no vitals taken for this visit. No data found.   Physical Exam  Constitutional: She is oriented to person, place, and time. She appears well-developed and well-nourished.  HENT:  Head: Normocephalic and atraumatic.  Cardiovascular: Normal rate.   Pulmonary/Chest: Effort normal.  Abdominal: Soft. Bowel sounds are normal. She exhibits no distension. There is no tenderness.  Genitourinary: Rectum normal and uterus normal. Pelvic exam was performed with patient supine. No labial fusion. There is no rash, tenderness, lesion or injury on the right labia. There is no rash, tenderness, lesion or injury on the left labia. Cervix exhibits no motion tenderness, no discharge and no friability. No erythema, tenderness or bleeding in the vagina.  There is a foreign body in the vagina. No signs of injury around the vagina. Vaginal discharge found.  Neurological: She is alert and oriented to person, place, and time.  Skin: Skin is warm and dry.  Psychiatric: She has a normal mood and affect.    Urgent Care Course  Clinical Course    Procedures (including critical care time)  Labs Review Labs Reviewed  CERVICOVAGINAL ANCILLARY ONLY    Imaging Review No results found.   Visual Acuity Review  Right Eye Distance:   Left Eye Distance:   Bilateral Distance:    Right Eye Near:   Left Eye Near:    Bilateral Near:         MDM   1. Vaginal foreign body, initial encounter    2. Bacterial vaginitis   Pelvic exam revealed foreign body (tampon from approx 3 weeks ago). Very foul odor so will treat w/ Flagyl for 7 days. HIV screening declined by pt. Discussion regarding dx, tx and home care and provided same in print.    [name], NP 06/05/16 1530

## 2016-06-07 LAB — CERVICOVAGINAL ANCILLARY ONLY
Chlamydia: NEGATIVE
Neisseria Gonorrhea: NEGATIVE

## 2016-06-07 LAB — POCT PREGNANCY, URINE: Preg Test, Ur: NEGATIVE

## 2016-06-08 LAB — CERVICOVAGINAL ANCILLARY ONLY: Wet Prep (BD Affirm): POSITIVE — AB

## 2016-06-12 ENCOUNTER — Telehealth (HOSPITAL_COMMUNITY): Payer: Self-pay | Admitting: Emergency Medicine

## 2016-06-12 NOTE — Telephone Encounter (Signed)
No answer at number in medical record.  Unable to contact.

## 2016-06-12 NOTE — Telephone Encounter (Signed)
-----   Message from Eustace Moore, MD sent at 06/10/2016  8:45 AM EDT ----- Please let patient know that test for gardnerella (bacterial vaginosis) was positive.  Rx for metronidazole was given at [facility] visit 06/05/16.  Recheck as needed if symptoms persist.  LM

## 2016-06-15 NOTE — Progress Notes (Signed)
06/12/2016 11:22am-left message for patient.  klh

## 2017-04-09 ENCOUNTER — Emergency Department (HOSPITAL_COMMUNITY): Payer: Self-pay

## 2017-04-09 ENCOUNTER — Encounter (HOSPITAL_COMMUNITY): Payer: Self-pay

## 2017-04-09 ENCOUNTER — Emergency Department (HOSPITAL_COMMUNITY)
Admission: EM | Admit: 2017-04-09 | Discharge: 2017-04-09 | Disposition: A | Discharge status: Home / Self Care | Payer: Self-pay | Attending: Emergency Medicine | Admitting: Emergency Medicine

## 2017-04-09 DIAGNOSIS — W228XXA Striking against or struck by other objects, initial encounter: Secondary | ICD-10-CM | POA: Insufficient documentation

## 2017-04-09 DIAGNOSIS — L03113 Cellulitis of right upper limb: Secondary | ICD-10-CM | POA: Insufficient documentation

## 2017-04-09 DIAGNOSIS — Z79899 Other long term (current) drug therapy: Secondary | ICD-10-CM | POA: Insufficient documentation

## 2017-04-09 DIAGNOSIS — Y9289 Other specified places as the place of occurrence of the external cause: Secondary | ICD-10-CM | POA: Insufficient documentation

## 2017-04-09 DIAGNOSIS — Y939 Activity, unspecified: Secondary | ICD-10-CM | POA: Insufficient documentation

## 2017-04-09 DIAGNOSIS — Y990 Civilian activity done for income or pay: Secondary | ICD-10-CM | POA: Insufficient documentation

## 2017-04-09 DIAGNOSIS — F17210 Nicotine dependence, cigarettes, uncomplicated: Secondary | ICD-10-CM | POA: Insufficient documentation

## 2017-04-09 MED ORDER — IBUPROFEN 200 MG PO TABS
600.0000 mg | ORAL_TABLET | Freq: Once | ORAL | Status: AC
  Administered 2017-04-09: 600 mg via ORAL
  Filled 2017-04-09: qty 3

## 2017-04-09 MED ORDER — ACETAMINOPHEN 500 MG PO TABS
1000.0000 mg | ORAL_TABLET | Freq: Once | ORAL | Status: AC
  Administered 2017-04-09: 1000 mg via ORAL
  Filled 2017-04-09: qty 2

## 2017-04-09 MED ORDER — CEPHALEXIN 250 MG PO CAPS
250.0000 mg | ORAL_CAPSULE | Freq: Once | ORAL | Status: AC
  Administered 2017-04-09: 250 mg via ORAL
  Filled 2017-04-09: qty 1

## 2017-04-09 MED ORDER — CEPHALEXIN 500 MG PO CAPS: 500.0000 mg | ORAL_CAPSULE | Freq: Four times a day (QID) | ORAL | 0 refills | Status: AC

## 2017-04-09 NOTE — Discharge Instructions (Signed)
Your hand x-rays were normal. Your symptoms are most likely from a superficial skin infection, cellulitis. We will treat this with antibiotics and high-dose anti-inflammatories.  Please take prescribed Keflex as instructed. For pain, swelling and inflammation please take 600 minute labs of ibuprofen +1000 mg of Tylenol every 8 hours .  Please monitor for signs of worsening infection, return to the emergency department if the redness expands past the marker lines after 48 hours of being on antibiotics.  Return to the emergency department if you experience fevers, worsening swelling, pain with range of motion of the right hand.

## 2017-04-09 NOTE — ED Provider Notes (Signed)
[facility]-EMERGENCY DEPT Provider Note   CSN: [id number] Arrival date & time: 04/09/17  1732     History   Chief Complaint Chief Complaint  Patient presents with  . Hand Injury    HPI Lorraine Paul is a 26 y.o. female presents to the ED with gradually worsening right hand erythema, edema, warmth and pain 2 days. Patient reports she was working when a box fell on her hand prior to symptom onset. 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 Patient reports she tried to poke sewing needles into the area of swelling to try and decrease the pressure and to drain it out, no drainage obtained. She reports previous history of cellulitis to her upper extremities. Denies fevers, chills, nausea, pain with range of motion of the right hand or wrist.  HPI  Past Medical History:  Diagnosis Date  . Ovarian cyst   . Skin infection 2016    Patient Active Problem List   Diagnosis Date Noted  . Cellulitis and abscess of hand 04/08/2016    Past Surgical History:  Procedure Laterality Date  . I&D EXTREMITY Left 04/08/2016   Procedure: IRRIGATION AND DEBRIDEMENT OF HAND;  Surgeon: Knute Neu, MD;  Location: [facility] OR;  Service: Plastics;  Laterality: Left;  . OVARIAN CYST REMOVAL      OB History    No data available       Home Medications    Prior to Admission medications   Medication Sig Start Date End Date Taking? Authorizing Provider  alprazolam Prudy Feeler) 2 MG tablet Take 1-2 mg by mouth 3 (three) times daily as needed for anxiety.     [provider]  Asenapine Maleate 10 MG SUBL Place 1 each under the tongue daily.    [provider]  cephALEXin (KEFLEX) 500 MG capsule Take 1 capsule (500 mg total) by mouth 4 (four) times daily. 04/09/17 04/16/17  Liberty Handy, PA-C  metroNIDAZOLE (FLAGYL) 500 MG tablet Take 1 tablet (500 mg total) by mouth 2 (two) times daily. 06/05/16   Schorr, Roma Kayser, NP    Family History History reviewed. No pertinent family  history.  Social History Social History  Substance Use Topics  . Smoking status: Current Every Day Smoker    Packs/day: 0.25    Types: Cigarettes  . Smokeless tobacco: Never Used  . Alcohol use Yes     Comment: rarely     Allergies   Tylenol [acetaminophen]   Review of Systems Review of Systems  Constitutional: Negative for chills and fever.  Gastrointestinal: Negative for nausea.  Musculoskeletal: Negative for arthralgias.  Skin: Positive for color change and wound.     Physical Exam Updated Vital Signs BP 122/88 (BP Location: Left Arm)   Pulse 85   Temp 98.7 F (37.1 C) (Oral)   Resp 16   Ht 5\' 4"  (1.626 m)   Wt 56.7 kg (125 lb)   SpO2 98%   BMI 21.46 kg/m   Physical Exam  Constitutional: She is oriented to person, place, and time. She appears well-developed and well-nourished. No distress.  NAD.  HENT:  Head: Normocephalic and atraumatic.  Right Ear: External ear normal.  Left Ear: External ear normal.  Nose: Nose normal.  Eyes: Conjunctivae and EOM are normal. Pupils are equal, round, and reactive to light. No scleral icterus.  Neck: Normal range of motion. Neck supple.  Cardiovascular: Normal rate, regular rhythm and normal heart sounds.   No murmur heard. Pulmonary/Chest: Effort normal and  breath sounds normal. She has no wheezes.  Musculoskeletal: Normal range of motion. She exhibits tenderness. She exhibits no edema or deformity.  Tenderness to ulnar aspect of right dorsal hand. Full active range of motion of right digits and wrist without reported pain.  Neurological: She is alert and oriented to person, place, and time.  Skin: Skin is warm and dry. Capillary refill takes less than 2 seconds. Lesion noted. There is erythema.  Please see picture. Well-demarcated area of erythema, edema and warmth with tenderness to the ulnar aspect of the dorsal hand. No fluctuance around this area. Multiple skin abrasions suspicious for IV track marks to bilateral  dorsal hands.  Psychiatric: She has a normal mood and affect. Her behavior is normal. Judgment and thought content normal.  Nursing note and vitals reviewed.      ED Treatments / Results  Labs (all labs ordered are listed, but only abnormal results are displayed) Labs Reviewed - No data to display  EKG  EKG Interpretation None       Radiology Dg Wrist Complete Right  Result Date: 04/09/2017 CLINICAL DATA:  Box fell on right arm 2 days ago. Swelling and pain. EXAM: RIGHT WRIST - COMPLETE 3+ VIEW COMPARISON:  None. FINDINGS: There is no evidence of fracture or dislocation. There is no evidence of arthropathy or other focal bone abnormality. Soft tissues are unremarkable. IMPRESSION: Negative. Electronically Signed   By: Charlett NoseKevin  Dover M.D.   On: 04/09/2017 18:27    Procedures Procedures (including critical care time)  Medications Ordered in ED Medications  cephALEXin (KEFLEX) capsule 250 mg (250 mg Oral Given 04/09/17 2006)  acetaminophen (TYLENOL) tablet 1,000 mg (1,000 mg Oral Given 04/09/17 2006)  ibuprofen (ADVIL,MOTRIN) tablet 600 mg (600 mg Oral Given 04/09/17 2006)     Initial Impression / Assessment and Plan / ED Course  I have reviewed the triage vital signs and the nursing notes.  Pertinent labs & imaging results that were available during my care of the patient were reviewed by me and considered in my medical decision making (see chart for details).     Physical exam consistent with cellulitis. Patient has documented history of upper extremity cellulitis. She is an IV drug user and she injects in this area. She admits to using sewing needles to "drain and released the pressure" to area of cellulitis which likely exacerbated the process. X-ray of hands are unremarkable. No evidence of osteomyelitis. No pain with range of motion of the right hand. No fluctuance suggest abscess amenable to drainage today. No fever. Patient is considered safe for discharge with antibiotic for  cellulitis. Area was marked and she was encouraged to monitor progression of redness and return if it expands or starts to streak up her arm, she becomes febrile, she has decreased range of motion or pain with range of motion of the right hand. Patient verbalized understanding to ED return precautions. First dose of antibiotics given in the ED.  Final Clinical Impressions(s) / ED Diagnoses   Final diagnoses:  Cellulitis of right upper extremity    New Prescriptions Discharge Medication List as of 04/09/2017  7:54 PM    START taking these medications   Details  cephALEXin (KEFLEX) 500 MG capsule Take 1 capsule (500 mg total) by mouth 4 (four) times daily., Starting Sat 04/09/2017, Until Sat 04/16/2017, Print         Liberty HandyGibbons, Claudia J, PA-C 04/09/17 2132    Rolland PorterJames, Mark, MD 04/22/17 1450

## 2017-04-09 NOTE — ED Triage Notes (Signed)
Pt states that a box fell on her R arm at work 2 nights ago. Wrist is now red, swollen, and painful. She reports trying to poke it at home with a needle to relieve some swelling, but it didn't help. A&Ox4.

## 2018-10-22 ENCOUNTER — Encounter (HOSPITAL_COMMUNITY): Payer: Self-pay

## 2018-10-22 ENCOUNTER — Emergency Department (HOSPITAL_COMMUNITY)
Admission: EM | Admit: 2018-10-22 | Discharge: 2018-10-22 | Disposition: A | Discharge status: Home / Self Care | Payer: Self-pay | Attending: Emergency Medicine | Admitting: Emergency Medicine

## 2018-10-22 DIAGNOSIS — Y929 Unspecified place or not applicable: Secondary | ICD-10-CM | POA: Insufficient documentation

## 2018-10-22 DIAGNOSIS — Y999 Unspecified external cause status: Secondary | ICD-10-CM | POA: Insufficient documentation

## 2018-10-22 DIAGNOSIS — W57XXXA Bitten or stung by nonvenomous insect and other nonvenomous arthropods, initial encounter: Secondary | ICD-10-CM | POA: Insufficient documentation

## 2018-10-22 DIAGNOSIS — F17210 Nicotine dependence, cigarettes, uncomplicated: Secondary | ICD-10-CM | POA: Insufficient documentation

## 2018-10-22 DIAGNOSIS — Z79899 Other long term (current) drug therapy: Secondary | ICD-10-CM | POA: Insufficient documentation

## 2018-10-22 DIAGNOSIS — S90562A Insect bite (nonvenomous), left ankle, initial encounter: Secondary | ICD-10-CM | POA: Insufficient documentation

## 2018-10-22 DIAGNOSIS — Y939 Activity, unspecified: Secondary | ICD-10-CM | POA: Insufficient documentation

## 2018-10-22 MED ORDER — SULFAMETHOXAZOLE-TRIMETHOPRIM 800-160 MG PO TABS: 1.0000 | ORAL_TABLET | Freq: Two times a day (BID) | ORAL | 0 refills | Status: AC

## 2018-10-22 NOTE — ED Triage Notes (Signed)
Patient thinks she got a bug bite or cut on left ankle that she noticed 5 days ago. Patient marked bite with circle yesterday. Swelling is now slightly larger then circle (size of a quarter). Patient went to [place] yesterday and was told it was a bite. Patient was unable to wait and left with being seen.   Patient ambulatory in triage.  A/Ox4.

## 2018-10-22 NOTE — ED Provider Notes (Signed)
[facility]-EMERGENCY DEPT Provider Note   CSN: [id number] Arrival date & time: 10/22/18  1646     History   Chief Complaint Chief Complaint  Patient presents with  . Insect Bite    HPI Lorraine Paul is a 27 y.o. female who presents to the ED with a wound to the left ankle. Patient reports seeing the area 5 days ago and was not sure if it was an insect bite or a cut. Now the area is larger with redness. Patient went to [facility] yesterday and was told it was a bite. Patient did not wait to get treated. Patient concerned because she drew a circle where the redness was yesterday and today the redness of outside the circle.  HPI  Past Medical History:  Diagnosis Date  . Ovarian cyst   . Skin infection 2016    Patient Active Problem List   Diagnosis Date Noted  . Cellulitis and abscess of hand 04/08/2016    Past Surgical History:  Procedure Laterality Date  . I&D EXTREMITY Left 04/08/2016   Procedure: IRRIGATION AND DEBRIDEMENT OF HAND;  Surgeon: Knute Neu, MD;  Location: [facility] OR;  Service: Plastics;  Laterality: Left;  . OVARIAN CYST REMOVAL       OB History   No obstetric history on file.      Home Medications    Prior to Admission medications   Medication Sig Start Date End Date Taking? Authorizing Provider  alprazolam Prudy Feeler) 2 MG tablet Take 1-2 mg by mouth 3 (three) times daily as needed for anxiety.     [provider]  Asenapine Maleate 10 MG SUBL Place 1 each under the tongue daily.    [provider]  metroNIDAZOLE (FLAGYL) 500 MG tablet Take 1 tablet (500 mg total) by mouth 2 (two) times daily. 06/05/16   Schorr, Roma Kayser, NP  sulfamethoxazole-trimethoprim (BACTRIM DS,SEPTRA DS) 800-160 MG tablet Take 1 tablet by mouth 2 (two) times daily for 7 days. 10/22/18 10/29/18  Janne Napoleon, NP    Family History History reviewed. No pertinent family history.  Social History Social History   Tobacco Use  . Smoking status:  Current Every Day Smoker    Packs/day: 0.25    Types: Cigarettes  . Smokeless tobacco: Never Used  Substance Use Topics  . Alcohol use: Yes    Comment: rarely  . Drug use: No     Allergies   Tylenol [acetaminophen]   Review of Systems Review of Systems  Musculoskeletal: Positive for arthralgias.  Skin: Positive for color change and wound.  All other systems reviewed and are negative.    Physical Exam Updated Vital Signs BP (!) 145/61 (BP Location: Right Arm)   Pulse 98   Temp 99.1 F (37.3 C) (Oral)   Resp 16   SpO2 100%   Physical Exam Vitals signs and nursing note reviewed.  Constitutional:      General: She is not in acute distress.    Appearance: She is well-developed.  HENT:     Head: Normocephalic.  Neck:     Musculoskeletal: Neck supple.  Cardiovascular:     Rate and Rhythm: Normal rate.  Pulmonary:     Effort: Pulmonary effort is normal.  Musculoskeletal: Normal range of motion.     Left ankle: She exhibits normal range of motion, no ecchymosis and normal pulse. Swelling: minimal. Tenderness.       Feet:     Comments: Area of erythema with a pustular center to  the left lateral ankle. There is a circle drawn with redness inside and outside the circle.   Skin:    Comments: Wound to left lateral ankle with erythema.  Neurological:     Mental Status: She is alert and oriented to person, place, and time.      ED Treatments / Results  Labs (all labs ordered are listed, but only abnormal results are displayed) Labs Reviewed - No data to display  Radiology No results found.  Procedures Procedures (including critical care time)  Medications Ordered in ED Medications - No data to display   Initial Impression / Assessment and Plan / ED Course  I have reviewed the triage vital signs and the nursing notes. 27 y.o. female here with redness and tenderness to the left lateral ankle stable for d/c without fever and does not appear toxic. Will treat  for mild cellulitis and patient will f/u with her PCP in 2 days or return here for worsening symptoms.   Final Clinical Impressions(s) / ED Diagnoses   Final diagnoses:  Bug bite with infection, initial encounter    ED Discharge Orders         Ordered    sulfamethoxazole-trimethoprim (BACTRIM DS,SEPTRA DS) 800-160 MG tablet  2 times daily     10/22/18 1859           Kerrie Buffaloeese,  SheffieldM, NP 10/22/18 1931    Tilden Fossaees, Elizabeth, MD 10/25/18 [phone]

## 2018-10-22 NOTE — Discharge Instructions (Addendum)
Take tylenol or ibuprofen as needed for pain. Return as needed for worsening symptoms.

## 2019-10-23 ENCOUNTER — Other Ambulatory Visit: Payer: Self-pay

## 2019-10-23 ENCOUNTER — Emergency Department (HOSPITAL_COMMUNITY)
Admission: EM | Admit: 2019-10-23 | Discharge: 2019-10-23 | Disposition: A | Discharge status: Home / Self Care | Payer: Self-pay | Attending: Emergency Medicine | Admitting: Emergency Medicine

## 2019-10-23 DIAGNOSIS — N760 Acute vaginitis: Secondary | ICD-10-CM | POA: Insufficient documentation

## 2019-10-23 DIAGNOSIS — Z79899 Other long term (current) drug therapy: Secondary | ICD-10-CM | POA: Insufficient documentation

## 2019-10-23 DIAGNOSIS — L509 Urticaria, unspecified: Secondary | ICD-10-CM | POA: Insufficient documentation

## 2019-10-23 DIAGNOSIS — B9689 Other specified bacterial agents as the cause of diseases classified elsewhere: Secondary | ICD-10-CM | POA: Insufficient documentation

## 2019-10-23 DIAGNOSIS — F17210 Nicotine dependence, cigarettes, uncomplicated: Secondary | ICD-10-CM | POA: Insufficient documentation

## 2019-10-23 MED ORDER — PREDNISONE 20 MG PO TABS
60.0000 mg | ORAL_TABLET | Freq: Once | ORAL | Status: AC
  Administered 2019-10-23: 60 mg via ORAL
  Filled 2019-10-23: qty 3

## 2019-10-23 MED ORDER — METRONIDAZOLE 500 MG PO TABS: 500.0000 mg | ORAL_TABLET | Freq: Two times a day (BID) | ORAL | 0 refills | Status: DC

## 2019-10-23 MED ORDER — PREDNISONE 10 MG PO TABS: 20.0000 mg | ORAL_TABLET | Freq: Every day | ORAL | 0 refills | Status: AC

## 2019-10-23 MED ORDER — DIPHENHYDRAMINE HCL 25 MG PO CAPS
25.0000 mg | ORAL_CAPSULE | Freq: Once | ORAL | Status: AC
  Administered 2019-10-23: 19:00:00 25 mg via ORAL
  Filled 2019-10-23: qty 1

## 2019-10-23 NOTE — ED Notes (Signed)
An After Visit Summary was printed and given to the patient. Discharge instructions given and no further questions at this time.  

## 2019-10-23 NOTE — ED Provider Notes (Signed)
[facility]-EMERGENCY DEPT Provider Note   CSN: [id number] Arrival date & time: 10/23/19  1729     History Chief Complaint  Patient presents with  . Allergic Reaction    Lorraine Paul is a 28 y.o. female.  HPI Patient with diffuse itchy raised red patchy rash that she noticed 4 PM today.  Patient states that it is spread up of her arms and feet.  Patient denies any wheezing, difficulty breathing, shortness of breath, cough, tongue swelling, mouth swelling or tenderness, abdominal pain, nausea, and denies any dizziness or lightheadedness.  Patient states she had some vaginal discharge yesterday and took a single dose of Bactrim for this.  Patient states that she has taken Bactrim in the past without any allergic reaction.   Patient denies any pelvic or abdominal pain.  Was concerned that she had bacterial vaginosis which prompted her to take the Bactrim yesterday.  Patient states that she has a long history of such and feels that she has the exact symptoms that she always has with bacterial vaginosis including discharge and odor.  Patient denies any urinary symptoms.  Denies any concern for sexually transmitted diseases.     Past Medical History:  Diagnosis Date  . Ovarian cyst   . Skin infection 2016    Patient Active Problem List   Diagnosis Date Noted  . Cellulitis and abscess of hand 04/08/2016    Past Surgical History:  Procedure Laterality Date  . I&D EXTREMITY Left 04/08/2016   Procedure: IRRIGATION AND DEBRIDEMENT OF HAND;  Surgeon: Knute NeuHarrill Coley, MD;  Location: [facility] OR;  Service: Plastics;  Laterality: Left;  . OVARIAN CYST REMOVAL       OB History   No obstetric history on file.     No family history on file.  Social History   Tobacco Use  . Smoking status: Current Every Day Smoker    Packs/day: 0.25    Types: Cigarettes  . Smokeless tobacco: Never Used  Substance Use Topics  . Alcohol use: Yes    Comment: rarely  . Drug use: No     Home Medications Prior to Admission medications   Medication Sig Start Date End Date Taking? Authorizing Provider  alprazolam Prudy Feeler(XANAX) 2 MG tablet Take 1-2 mg by mouth 3 (three) times daily as needed for anxiety.     [provider]  Asenapine Maleate 10 MG SUBL Place 1 each under the tongue daily.    [provider]  metroNIDAZOLE (FLAGYL) 500 MG tablet Take 1 tablet (500 mg total) by mouth 2 (two) times daily. 10/23/19   Gailen ShelterFondaw, Martisha Toulouse S, PA  predniSONE (DELTASONE) 10 MG tablet Take 2 tablets (20 mg total) by mouth daily for 3 days. 10/23/19 10/26/19  Gailen ShelterFondaw, Wilmore Holsomback S, PA    Allergies    Tylenol [acetaminophen]  Review of Systems   Review of Systems  Constitutional: Negative for chills and fever.  HENT: Negative for congestion.   Respiratory: Negative for cough, chest tightness, shortness of breath, wheezing and stridor.   Cardiovascular: Negative for chest pain.  Gastrointestinal: Negative for abdominal pain and nausea.  Genitourinary: Positive for vaginal discharge. Negative for dysuria.  Musculoskeletal: Negative for back pain.  Skin: Positive for color change and rash.    Physical Exam Updated Vital Signs BP 121/74   Pulse 78   Temp 98.7 F (37.1 C) (Oral)   Resp 18   Ht 5\' 4"  (1.626 m)   Wt 54.4 kg   SpO2 99%  BMI 20.60 kg/m   Physical Exam Vitals and nursing note reviewed.  Constitutional:      General: She is not in acute distress.    Appearance: Normal appearance. She is not ill-appearing.  HENT:     Head: Normocephalic and atraumatic.     Mouth/Throat:     Mouth: Mucous membranes are moist.     Comments: No tongue or posterior pharynx swelling. Eyes:     General: No scleral icterus.       Right eye: No discharge.        Left eye: No discharge.     Conjunctiva/sclera: Conjunctivae normal.  Cardiovascular:     Rate and Rhythm: Normal rate and regular rhythm.     Pulses: Normal pulses.  Pulmonary:     Effort: Pulmonary effort is  normal. No respiratory distress.     Breath sounds: Normal breath sounds. No stridor. No wheezing.     Comments: No wheezing.  No increased work of breathing.  Speaking full sentences. Abdominal:     Tenderness: There is no abdominal tenderness. There is no right CVA tenderness, left CVA tenderness or guarding.     Hernia: No hernia is present.  Musculoskeletal:        General: No swelling.     Right lower leg: No edema.     Left lower leg: No edema.  Skin:    Comments: Urticarial rash over bilateral feet and upper thigh and glute  Neurological:     Mental Status: She is alert and oriented to person, place, and time. Mental status is at baseline.     ED Results / Procedures / Treatments   Labs (all labs ordered are listed, but only abnormal results are displayed) Labs Reviewed - No data to display  EKG None  Radiology No results found.  Procedures Procedures (including critical care time)  Medications Ordered in ED Medications  diphenhydrAMINE (BENADRYL) capsule 25 mg (25 mg Oral Given 10/23/19 1856)  predniSONE (DELTASONE) tablet 60 mg (60 mg Oral Given 10/23/19 1856)    ED Course  I have reviewed the triage vital signs and the nursing notes.  Pertinent labs & imaging results that were available during my care of the patient were reviewed by me and considered in my medical decision making (see chart for details).    MDM Rules/Calculators/A&P                       Patient presents today with urticarial cutaneous reaction that occurred after she took Bactrim.  Patient is unsure if that is what caused her reaction she has not had a history of similar reactions of Bactrim to the past however she does have urticarial reaction we will treat with prednisone and Benadryl here.  She will continue use Benadryl at home and arms will discharge with a short course of prednisone.  Patient observed in ED for 2.5 hours and feels much improved.  Doubt anaphylaxis as she has no  wheezing, abdominal pain, nausea, hypotension or other symptoms of such.  Doubt syphilis, hand-foot-and-mouth, SJS, TENS or other concerning rash.  Patient has long history of acute vaginosis with similar symptoms that she is having today. Shared decision making conversation shared with patient. Patient states she would appreciate being treated for BV.  Declines pelvic exam after shared decision making--will treat empirically. Will discharge with flagyl for 1 week.  Discussed probiotic use. Will also give 3 days of prednisone and recommend benadryl at home. Patient  given return precautions. Understands concerning symptoms that would require a prompt ED return.  This patient appears reasonably screened and I doubt any other medical condition requiring further workup, evaluation, or treatment in the ED at this time prior to discharge.   Patient's vitals are WNL apart from vital sign abnormalities discussed above, patient is in NAD, and able to ambulate in the ED at their baseline. Pain has been managed or a plan has been made for home management and has no complaints prior to discharge. Patient is comfortable with above plan and is stable for discharge at this time. All questions were answered prior to disposition. Results from the ER workup discussed with the patient face to face and all questions answered to the best of my ability. The patient is safe for discharge with strict return precautions. Patient appears safe for discharge with appropriate follow-up. Conveyed my impression with the patient and they voiced understanding and are agreeable to plan.   An After Visit Summary was printed and given to the patient.  Portions of this note were generated with Scientist, clinical (histocompatibility and immunogenetics). Dictation errors may occur despite best attempts at proofreading.    Final Clinical Impression(s) / ED Diagnoses Final diagnoses:  Urticaria  Bacterial vaginosis    Rx / DC Orders ED Discharge Orders         Ordered     metroNIDAZOLE (FLAGYL) 500 MG tablet  2 times daily     10/23/19 1959    predniSONE (DELTASONE) 10 MG tablet  Daily     10/23/19 1959           Solon Augusta Alvord, [place] 10/24/19 Gwenyth Bender, MD 10/29/19 1828

## 2019-10-23 NOTE — ED Triage Notes (Signed)
Pt reports taking 1 Bactrim pill d/t vaginal discharge at approx 0045. Patient brought prescription bottle which states expiration date is 11/03/2008. Patient noticed itching and blue-ish/pink rash of both hands today at approx 1600. States rash is spreading up arms and feet. Patient states she is having mild difficulty breathing. Took benadryl 25 mg at 1300.

## 2019-10-23 NOTE — Discharge Instructions (Addendum)
Please take prednisone in the morning for the next 3 days.  Continue to use Benadryl 25 mg every 6 hours for the next 3 days.  Return immediately to ED if you have any difficulty breathing.

## 2019-10-23 NOTE — ED Notes (Signed)
Patient ambulated to room without assistance or distress.

## 2019-10-30 ENCOUNTER — Ambulatory Visit: Payer: HRSA Program | Attending: Internal Medicine

## 2019-10-30 DIAGNOSIS — Z20828 Contact with and (suspected) exposure to other viral communicable diseases: Secondary | ICD-10-CM | POA: Insufficient documentation

## 2019-10-30 DIAGNOSIS — Z20822 Contact with and (suspected) exposure to covid-19: Secondary | ICD-10-CM

## 2019-11-01 LAB — NOVEL CORONAVIRUS, NAA: SARS-CoV-2, NAA: NOT DETECTED

## 2021-07-24 ENCOUNTER — Other Ambulatory Visit: Payer: Self-pay

## 2021-07-24 DIAGNOSIS — F17210 Nicotine dependence, cigarettes, uncomplicated: Secondary | ICD-10-CM | POA: Insufficient documentation

## 2021-07-24 DIAGNOSIS — Y900 Blood alcohol level of less than 20 mg/100 ml: Secondary | ICD-10-CM | POA: Insufficient documentation

## 2021-07-24 DIAGNOSIS — F1123 Opioid dependence with withdrawal: Secondary | ICD-10-CM | POA: Insufficient documentation

## 2021-07-24 DIAGNOSIS — Z046 Encounter for general psychiatric examination, requested by authority: Secondary | ICD-10-CM | POA: Insufficient documentation

## 2021-07-24 DIAGNOSIS — R45851 Suicidal ideations: Secondary | ICD-10-CM | POA: Insufficient documentation

## 2021-07-24 LAB — CBC
HCT: 46.2 % — ABNORMAL HIGH (ref 36.0–46.0)
Hemoglobin: 15.2 g/dL — ABNORMAL HIGH (ref 12.0–15.0)
MCH: 28.3 pg (ref 26.0–34.0)
MCHC: 32.9 g/dL (ref 30.0–36.0)
MCV: 86 fL (ref 80.0–100.0)
Platelets: 252 10*3/uL (ref 150–400)
RBC: 5.37 MIL/uL — ABNORMAL HIGH (ref 3.87–5.11)
RDW: 14 % (ref 11.5–15.5)
WBC: 10 10*3/uL (ref 4.0–10.5)
nRBC: 0 % (ref 0.0–0.2)

## 2021-07-24 LAB — ETHANOL: Alcohol, Ethyl (B): <10 mg/dL (ref <10)

## 2021-07-24 LAB — ACETAMINOPHEN LEVEL: Acetaminophen (Tylenol), Serum: <10 ug/mL — ABNORMAL LOW (ref 10–30)

## 2021-07-24 NOTE — ED Triage Notes (Signed)
Pt states she was using heroin and called her mother today and her mother decided she was "being suicidal". Pt denies current SI or HI. Pt states 'my mother did this".

## 2021-07-24 NOTE — ED Notes (Signed)
Pt has a clitoral piercing she cannot remove at this time, it appears stuck in place. The following items placed in a labeled bag and given to police to hold while pt in subwait: black sneakers, black socks, navel reing, black underwear, pink pants, teal shirt, white shirt, grey bra, 4 earrings grey metal, brown hair band, yellow metal ring with clear stones.

## 2021-07-25 ENCOUNTER — Emergency Department
Admission: EM | Admit: 2021-07-25 | Discharge: 2021-07-25 | Disposition: A | Discharge status: Home / Self Care | Payer: Self-pay | Attending: Emergency Medicine | Admitting: Emergency Medicine

## 2021-07-25 DIAGNOSIS — F1120 Opioid dependence, uncomplicated: Secondary | ICD-10-CM | POA: Diagnosis present

## 2021-07-25 DIAGNOSIS — F1123 Opioid dependence with withdrawal: Secondary | ICD-10-CM

## 2021-07-25 DIAGNOSIS — F1193 Opioid use, unspecified with withdrawal: Secondary | ICD-10-CM | POA: Diagnosis present

## 2021-07-25 DIAGNOSIS — F1990 Other psychoactive substance use, unspecified, uncomplicated: Secondary | ICD-10-CM

## 2021-07-25 DIAGNOSIS — F1994 Other psychoactive substance use, unspecified with psychoactive substance-induced mood disorder: Secondary | ICD-10-CM

## 2021-07-25 NOTE — BH Assessment (Signed)
Comprehensive Clinical Assessment (CCA) Note  07/25/2021 Lorraine Paul [id number]  Chief Complaint: Patient is a 30 year old female presenting to [facility] ED under IVC. Per triage note Pt states she was using heroin and called her mother today and her mother decided she was "being suicidal". Pt denies current SI or HI. Pt states 'my mother did this". During assessment patient appears alert and oriented x4, calm and cooperative. Patient reports "I fucked up, I called my mom yesterday because I left the clinic I was at and ended up relapsing." Patient denies SI/HI/AH/VH and does not appear to be responding to any internal or external stimuli.  Per Psyc NP Lerry Liner patient is psyc cleared Chief Complaint  Patient presents with   IVC   Visit Diagnosis: Substance Use   CCA Screening, Triage and Referral (STR)  Patient Reported Information How did you hear about [place]? Legal System  Referral name: No data recorded Referral phone number: No data recorded  Whom do you see for routine medical problems? No data recorded Practice/Facility Name: No data recorded Practice/Facility Phone Number: No data recorded Name of Contact: No data recorded Contact Number: No data recorded Contact Fax Number: No data recorded Prescriber Name: No data recorded Prescriber Address (if known): No data recorded  What Is the Reason for Your Visit/Call Today? Patient is under IVC  How Long Has This Been Causing You Problems? <Week  What Do You Feel Would Help You the Most Today? No data recorded  Have You Recently Been in Any Inpatient Treatment (Hospital/Detox/Crisis Center/28-Day Program)? No data recorded Name/Location of Program/Hospital:No data recorded How Long Were You There? No data recorded When Were You Discharged? No data recorded  Have You Ever Received Services From [facility] Before? No data recorded Who Do You See at [facility]? No data recorded  Have You Recently Had Any Thoughts About  Hurting Yourself? No  Are You Planning to Commit Suicide/Harm Yourself At This time? No   Have you Recently Had Thoughts About Hurting Someone Karolee Ohs? No  Explanation: No data recorded  Have You Used Any Alcohol or Drugs in the Past 24 Hours? No  How Long Ago Did You Use Drugs or Alcohol? No data recorded What Did You Use and How Much? No data recorded  Do You Currently Have a Therapist/Psychiatrist? No  Name of Therapist/Psychiatrist: No data recorded  Have You Been Recently Discharged From Any Office Practice or Programs? No  Explanation of Discharge From Practice/Program: No data recorded    CCA Screening Triage Referral Assessment Type of Contact: Face-to-Face  Is this Initial or Reassessment? No data recorded Date Telepsych consult ordered in [facility]:  No data recorded Time Telepsych consult ordered in [facility]:  No data recorded  Patient Reported Information Reviewed? No data recorded Patient Left Without Being Seen? No data recorded Reason for Not Completing Assessment: No data recorded  Collateral Involvement: No data recorded  Does Patient Have a Court Appointed Legal Guardian? No data recorded Name and Contact of Legal Guardian: No data recorded If Minor and Not Living with Parent(s), Who has Custody? No data recorded Is CPS involved or ever been involved? Never  Is APS involved or ever been involved? Never   Patient Determined To Be At Risk for Harm To Self or Others Based on Review of Patient Reported Information or Presenting Complaint? No  Method: No data recorded Availability of Means: No data recorded Intent: No data recorded Notification Required: No data recorded Additional Information for Danger to  Others Potential: No data recorded Additional Comments for Danger to Others Potential: No data recorded Are There Guns or Other Weapons in Your Home? No data recorded Types of Guns/Weapons: No data recorded Are These Weapons Safely Secured?                             No data recorded Who Could Verify You Are Able To Have These Secured: No data recorded Do You Have any Outstanding Charges, Pending Court Dates, Parole/Probation? No data recorded Contacted To Inform of Risk of Harm To Self or Others: No data recorded  Location of Assessment: [facility] ED   Does Patient Present under Involuntary Commitment? Yes  IVC Papers Initial File Date: 07/24/21   [place] of Residence: [place]   Patient Currently Receiving the Following Services: No data recorded  Determination of Need: Emergent (2 hours)   Options For Referral: No data recorded    CCA Biopsychosocial Intake/Chief Complaint:  No data recorded Current Symptoms/Problems: No data recorded  Patient Reported Schizophrenia/Schizoaffective Diagnosis in Past: No   Strengths: Patient is able to communicate  Preferences: No data recorded Abilities: No data recorded  Type of Services Patient Feels are Needed: No data recorded  Initial Clinical Notes/Concerns: No data recorded  Mental Health Symptoms Depression:   None   Duration of Depressive symptoms: No data recorded  Mania:   None   Anxiety:    None   Psychosis:   None   Duration of Psychotic symptoms: No data recorded  Trauma:   None   Obsessions:   None   Compulsions:   None   Inattention:   None   Hyperactivity/Impulsivity:   None   Oppositional/Defiant Behaviors:   None   Emotional Irregularity:   None   Other Mood/Personality Symptoms:  No data recorded   Mental Status Exam Appearance and self-care  Stature:   Average   Weight:   Average weight   Clothing:   Casual   Grooming:   Normal   Cosmetic use:   None   Posture/gait:   Normal   Motor activity:   Not Remarkable   Sensorium  Attention:   Normal   Concentration:   Normal   Orientation:   X5   Recall/memory:   Normal   Affect and Mood  Affect:   Appropriate   Mood:   Other (Comment)   Relating  Eye  contact:   Avoided   Facial expression:   Responsive   Attitude toward examiner:   Cooperative   Thought and Language  Speech flow:  Clear and Coherent   Thought content:   Appropriate to Mood and Circumstances   Preoccupation:   None   Hallucinations:   None   Organization:  No data recorded  [employer] of Knowledge:   Fair   Intelligence:   Average   Abstraction:   Functional   Judgement:   Fair   Dance movement psychotherapist:   Adequate   Insight:   Lacking   Decision Making:   Impulsive   Social Functioning  Social Maturity:   Irresponsible   Social Judgement:   "Chief of Staff"   Stress  Stressors:   Family conflict   Coping Ability:   Normal   Skill Deficits:   None   Supports:   Family     Religion: Religion/Spirituality Are You A Religious Person?: No  Leisure/Recreation: Leisure / Recreation Do You Have Hobbies?: No  Exercise/Diet:  Exercise/Diet Do You Exercise?: No Have You Gained or Lost A Significant Amount of Weight in the Past Six Months?: No Do You Follow a Special Diet?: No Do You Have Any Trouble Sleeping?: No   CCA Employment/Education Employment/Work Situation: Employment / Work Situation Employment Situation: Unemployed Has Patient ever Been in Equities trader?: No  Education: Education Is Patient Currently Attending School?: No Did Theme park manager?: No Did You Have An Individualized Education Program (IIEP): No Did You Have Any Difficulty At [employer]?: No Patient's Education Has Been Impacted by Current Illness: No   CCA Family/Childhood History Family and Relationship History: Family history Marital status: Single Does patient have children?: No  Childhood History:  Childhood History Did patient suffer any verbal/emotional/physical/sexual abuse as a child?: No Did patient suffer from severe childhood neglect?: No Has patient ever been sexually abused/assaulted/raped as an adolescent or  adult?: No Was the patient ever a victim of a crime or a disaster?: No Witnessed domestic violence?: No Has patient been affected by domestic violence as an adult?: No  Child/Adolescent Assessment:     CCA Substance Use Alcohol/Drug Use: Alcohol / Drug Use Pain Medications: See MAR Prescriptions: See MAR Over the Counter: See MAR History of alcohol / drug use?: Yes Substance #1 Name of Substance 1: Heroin                       ASAM's:  Six Dimensions of Multidimensional Assessment  Dimension 1:  Acute Intoxication and/or Withdrawal Potential:      Dimension 2:  Biomedical Conditions and Complications:      Dimension 3:  Emotional, Behavioral, or Cognitive Conditions and Complications:     Dimension 4:  Readiness to Change:     Dimension 5:  Relapse, Continued use, or Continued Problem Potential:     Dimension 6:  Recovery/Living Environment:     ASAM Severity Score:    ASAM Recommended Level of Treatment:     Substance use Disorder (SUD)    Recommendations for Services/Supports/Treatments:    DSM5 Diagnoses: Patient Active Problem List   Diagnosis Date Noted   Drug use    Substance use disorder    Cellulitis and abscess of hand 04/08/2016    Patient Centered Plan: Patient is on the following Treatment Plan(s):  Substance Abuse   Referrals to Alternative Service(s): Referred to Alternative Service(s):   Place:   Date:   Time:    Referred to Alternative Service(s):   Place:   Date:   Time:    Referred to Alternative Service(s):   Place:   Date:   Time:    Referred to Alternative Service(s):   Place:   Date:   Time:     Najeeb Uptain A Bernis Schreur, LCAS-A

## 2021-07-25 NOTE — ED Notes (Signed)
Pt awake, frustrated and asking why she is in the hospital. Denies any SI/HI presently. States she has been without her Methadone x 48hrs and wants to go home

## 2021-07-25 NOTE — Discharge Instructions (Addendum)
You have been seen in the emergency department for a  psychiatric concern. You have been evaluated both medically as well as psychiatrically. Please follow-up with your outpatient resources provided. Return to the emergency department for any worsening symptoms, or any thoughts of hurting yourself or anyone else so that we may attempt to help you. 

## 2021-07-25 NOTE — ED Provider Notes (Signed)
-----------------------------------------   10:56 AM on 07/25/2021 ----------------------------------------- Patient has been seen and evaluated by psychiatry.  They do not believe the patient requires admission from a psychiatric standpoint.  They have rescinded the patient's IVC she does not meet IVC criteria.  Patient's medical work-up largely nonrevealing.  Patient will be discharged home.   Minna Antis, MD 07/25/21 1056

## 2021-07-25 NOTE — ED Notes (Signed)
Pt reports that she was hiding from her mother that she has not been using her suboxone for the last 2 years. States she called her mother last night and is unclear on the detail but is now here. Pt reports xanax use states she used a "peach" yesterday. Denies drug and alcohol use today. Denies SI, HI, AVH

## 2021-07-25 NOTE — ED Notes (Signed)
Dr. Derrill Kay states to cancel blood work that is unable to be obtained at this time.

## 2021-07-25 NOTE — Consult Note (Signed)
[facility] Face-to-Face Psychiatry Consult   Reason for Consult:  Psychiatric evaluation Referring Physician:  Dr. Dustin Flock Patient Identification: Lorraine Paul MRN:  [id number] Principal Diagnosis: <principal problem not specified> Diagnosis:  Active Problems:   * No active hospital problems. *   Total Time spent with patient: 45 minutes  Subjective:   Lorraine Paul is a 30 y.o. female patient admitted with substance induced intoxication.  HPI:  Tele Assessment  Lorraine Paul, 30 y.o., female patient presented to [facility] under IVC.  Patient seen  TTS and this provider; chart reviewed and consulted with Dr. Derrill Kay on 07/25/21.  On evaluation Lorraine Paul reports that she called her mother and told her she "fucked up" because used heroin today.  Patient's mom then proceeded to take IVC papers out on her inciting that she was a danger to herself.  During evaluation Lorraine Paul is laying in bed she is sleeping but easily awaken. Once awaken she is  alert/oriented x 4; calm/cooperative and mood congruent with affect.  Patient is speaking in a garbled and low tone at a low volume, and slowed pace; with minimal eye contact. Her thought process is coherent and relevant; There is no indication that she is currently responding to internal/external stimuli or experiencing delusional thought content.  Patient denies suicidal/self-harm/homicidal ideation, psychosis, and paranoia.  Patient has remained calm throughout assessment and has answered questions appropriately.   Recommendations:  Discharge in the am as long as patient remains psychiatrically stable   Past Psychiatric History: substance use disorder  Risk to Self:   Risk to Others:   Prior Inpatient Therapy:   Prior Outpatient Therapy:    Past Medical History:  Past Medical History:  Diagnosis Date   Ovarian cyst    Skin infection 2016    Past Surgical History:  Procedure Laterality Date   I & D EXTREMITY Left 04/08/2016   Procedure:  IRRIGATION AND DEBRIDEMENT OF HAND;  Surgeon: Knute Neu, MD;  Location: [facility] OR;  Service: Plastics;  Laterality: Left;   OVARIAN CYST REMOVAL     Family History: No family history on file. Family Psychiatric  History:  Social History:  Social History   Substance and Sexual Activity  Alcohol Use Yes   Comment: rarely     Social History   Substance and Sexual Activity  Drug Use No    Social History   Socioeconomic History   Marital status: Single    Spouse name: Not on file   Number of children: Not on file   Years of education: Not on file   Highest education level: Not on file  Occupational History   Not on file  Tobacco Use   Smoking status: Every Day    Packs/day: 0.25    Types: Cigarettes   Smokeless tobacco: Never  Substance and Sexual Activity   Alcohol use: Yes    Comment: rarely   Drug use: No   Sexual activity: Not on file  Other Topics Concern   Not on file  Social History Narrative   Not on file   Social Determinants of Health   Financial Resource Strain: Not on file  Food Insecurity: Not on file  Transportation Needs: Not on file  Physical Activity: Not on file  Stress: Not on file  Social Connections: Not on file   Additional Social History:    Allergies:   Allergies  Allergen Reactions   Tylenol [Acetaminophen]     nausea    Labs:  Results for orders placed or  performed during the hospital encounter of 07/25/21 (from the past 48 hour(s))  Ethanol     Status: None   Collection Time: 07/24/21  9:30 PM  Result Value Ref Range   Alcohol, Ethyl (B) <10 <10 mg/dL    Comment: (NOTE) Lowest detectable limit for serum alcohol is 10 mg/dL.  For medical purposes only. Performed at [facility], [address] [place], [place] [address]   Acetaminophen level     Status: Abnormal   Collection Time: 07/24/21  9:30 PM  Result Value Ref Range   Acetaminophen (Tylenol), Serum <10 (L) 10 - 30 ug/mL    Comment: (NOTE) Therapeutic  concentrations vary significantly. A range of 10-30 ug/mL  may be an effective concentration for many patients. However, some  are best treated at concentrations outside of this range. Acetaminophen concentrations >150 ug/mL at 4 hours after ingestion  and >50 ug/mL at 12 hours after ingestion are often associated with  toxic reactions.  Performed at [facility], [address] [place], [place] [address]   cbc     Status: Abnormal   Collection Time: 07/24/21  9:30 PM  Result Value Ref Range   WBC 10.0 4.0 - 10.5 K/uL   RBC 5.37 (H) 3.87 - 5.11 MIL/uL   Hemoglobin 15.2 (H) 12.0 - 15.0 g/dL   HCT 76.2 (H) 83.1 - 51.7 %   MCV 86.0 80.0 - 100.0 fL   MCH 28.3 26.0 - 34.0 pg   MCHC 32.9 30.0 - 36.0 g/dL   RDW 61.6 07.3 - 71.0 %   Platelets 252 150 - 400 K/uL   nRBC 0.0 0.0 - 0.2 %    Comment: Performed at [facility], [address] [place], [place] [address]    No current facility-administered medications for this encounter.   Current Outpatient Medications  Medication Sig Dispense Refill   alprazolam (XANAX) 2 MG tablet Take 1-2 mg by mouth 3 (three) times daily as needed for anxiety.      Asenapine Maleate 10 MG SUBL Place 1 each under the tongue daily.     metroNIDAZOLE (FLAGYL) 500 MG tablet Take 1 tablet (500 mg total) by mouth 2 (two) times daily. 14 tablet 0    Musculoskeletal: Strength & Muscle Tone: within normal limits Gait & Station: normal Patient leans: Right   Psychiatric Specialty Exam:  Presentation  General Appearance: Disheveled  Eye Contact:Fair  Speech:Garbled; Slow; Slurred  Speech Volume:Decreased  Handedness:Right   Mood and Affect  Mood:Anxious; Dysphoric  Affect:Congruent   Thought Process  Thought Processes:Coherent  Descriptions of Associations:Intact  Orientation:Full (Time, Place and Person)  Thought Content:No data recorded History of Schizophrenia/Schizoaffective disorder:No data recorded Duration  of Psychotic Symptoms:No data recorded Hallucinations:Hallucinations: None  Ideas of Reference:None  Suicidal Thoughts:Suicidal Thoughts: No  Homicidal Thoughts:Homicidal Thoughts: No   Sensorium  Memory:Immediate Fair; Recent Fair; Remote Fair  Judgment:Poor  Insight:Poor   Executive Functions  Concentration: No data recorded Attention Span:Poor  Recall:Poor  Fund of Knowledge:Poor  Language:Poor   Psychomotor Activity  Psychomotor Activity:Psychomotor Activity: Restlessness   Assets  Assets:Communication Skills; Financial Resources/Insurance; Housing; Social Support   Sleep  Sleep:Sleep: Fair   Physical Exam: Physical Exam Vitals and nursing note reviewed.  Constitutional:      General: She is in acute distress.     Appearance: She is toxic-appearing.  HENT:     Head: Normocephalic and atraumatic.     Nose: Nose normal.     Mouth/Throat:  Mouth: Mucous membranes are dry.  Eyes:     Pupils: Pupils are equal, round, and reactive to light.  Cardiovascular:     Rate and Rhythm: Normal rate.  Pulmonary:     Effort: Pulmonary effort is normal.  Musculoskeletal:        General: Normal range of motion.     Cervical back: Normal range of motion.  Skin:    General: Skin is warm and dry.  Neurological:     General: No focal deficit present.  Psychiatric:        Mood and Affect: Affect is blunt and flat.        Speech: Speech is slurred.        Behavior: Behavior is slowed and withdrawn.        Thought Content: Thought content normal.        Cognition and Memory: Cognition and memory normal.        Judgment: Judgment is impulsive.   Review of Systems  Psychiatric/Behavioral:  Positive for substance abuse. Negative for depression and suicidal ideas.   All other systems reviewed and are negative. Blood pressure (!) 128/95, pulse (!) 106, temperature 98.4 F (36.9 C), temperature source Oral, resp. rate 16, height 5\' 4"  (1.626 m), weight 52.6 kg,  SpO2 99 %. Body mass index is 19.91 kg/m.   Disposition: No evidence of imminent risk to self or others at present.   Patient does not meet criteria for psychiatric inpatient admission. Discussed crisis plan, support from social network, calling 911, coming to the Emergency Department, and calling Suicide Hotline.  , NP 07/25/2021 3:14 AM

## 2021-07-25 NOTE — ED Notes (Signed)
Attempt to collect patient redraws for blood work. Unsuccessful. Lab contacted at this time for collection, Rodney Booze states she will be here shortly

## 2021-07-25 NOTE — ED Notes (Signed)
Psych team to room now

## 2021-07-25 NOTE — Consult Note (Addendum)
[facility] Psych ED Discharge  07/25/2021 10:44 AM Lorraine Paul  MRN:  [id number]  Method of visit?: Face to Face   Principal Problem: Opiate withdrawal [facility]) Discharge Diagnoses: Principal Problem:   Opiate withdrawal (HCC) Active Problems:   Opiate dependence (HCC)   Substance induced mood disorder (HCC)   Subjective: "I'm good, I'm ready to go  home.  My mother was upset that I started using again.  I have never been suicidal in my life."  Client presented with concerns by her mother of suicidal ideations.  The client has adamantly denied this since admission to the ED.  She relapsed on heroin about three months ago and went on methadone to stop.  Then, she was not able to get her methadone and was  having some withdrawal symptoms.  She lives with her significant other, [name], and gave permission for the team to contact him.  Phone conversation witnessed by RN, [employer]. He had no safety concerns regarding her hurting herself.  "She did not feel good yesterday and said some things she did not mean.  We had a long talk.  I do not feel she is going or was going to hurt herself."  Detox/rehab recommended, client wants to go outpatient, resources provided.  No suicidal/homicidal ideations, hallucinations.  Some body aches and fatigue from lack of opiates.  Total Time spent with patient: 30 minutes  Past Psychiatric History: opiate use d/o  Past Medical History:  Past Medical History:  Diagnosis Date   Ovarian cyst    Skin infection 2016    Past Surgical History:  Procedure Laterality Date   I & D EXTREMITY Left 04/08/2016   Procedure: IRRIGATION AND DEBRIDEMENT OF HAND;  Surgeon: Knute Neu, MD;  Location: [facility] OR;  Service: Plastics;  Laterality: Left;   OVARIAN CYST REMOVAL     Family History: No family history on file. Family Psychiatric  History: none Social History:  Social History   Substance and Sexual Activity  Alcohol Use Yes   Comment: rarely     Social History    Substance and Sexual Activity  Drug Use No    Social History   Socioeconomic History   Marital status: Single    Spouse name: Not on file   Number of children: Not on file   Years of education: Not on file   Highest education level: Not on file  Occupational History   Not on file  Tobacco Use   Smoking status: Every Day    Packs/day: 0.25    Types: Cigarettes   Smokeless tobacco: Never  Substance and Sexual Activity   Alcohol use: Yes    Comment: rarely   Drug use: No   Sexual activity: Not on file  Other Topics Concern   Not on file  Social History Narrative   Not on file   Social Determinants of Health   Financial Resource Strain: Not on file  Food Insecurity: Not on file  Transportation Needs: Not on file  Physical Activity: Not on file  Stress: Not on file  Social Connections: Not on file    Tobacco Cessation:  A prescription for an FDA-approved tobacco cessation medication was offered at discharge and the patient refused  Current Medications: No current facility-administered medications for this encounter.   Current Outpatient Medications  Medication Sig Dispense Refill   fluconazole (DIFLUCAN) 150 MG tablet Take 150 mg by mouth once.     alprazolam (XANAX) 2 MG tablet Take 1-2 mg by mouth  3 (three) times daily as needed for anxiety.      Asenapine Maleate 10 MG SUBL Place 1 each under the tongue daily.     metroNIDAZOLE (FLAGYL) 500 MG tablet Take 1 tablet (500 mg total) by mouth 2 (two) times daily. (Patient not taking: No sig reported) 14 tablet 0   PTA Medications: (Not in a hospital admission)   Musculoskeletal: Strength & Muscle Tone: within normal limits Gait & Station: normal Patient leans: N/A Psychiatric Specialty Exam: Physical Exam Vitals and nursing note reviewed.  Constitutional:      Appearance: Normal appearance.  HENT:     Head: Normocephalic.     Nose: Nose normal.  Pulmonary:     Effort: Pulmonary effort is normal.   Musculoskeletal:        General: Normal range of motion.     Cervical back: Normal range of motion.  Neurological:     General: No focal deficit present.     Mental Status: She is alert and oriented to person, place, and time.  Psychiatric:        Attention and Perception: Attention and perception normal.        Mood and Affect: Mood is anxious.        Speech: Speech normal.        Behavior: Behavior normal. Behavior is cooperative.        Thought Content: Thought content normal.        Cognition and Memory: Cognition and memory normal.        Judgment: Judgment normal.    Review of Systems  Constitutional:  Positive for malaise/fatigue.  Musculoskeletal:  Positive for myalgias.  Psychiatric/Behavioral:  Positive for substance abuse. The patient is nervous/anxious.   All other systems reviewed and are negative.  Blood pressure (!) 128/95, pulse (!) 106, temperature 98.4 F (36.9 C), temperature source Oral, resp. rate 16, height 5\' 4"  (1.626 m), weight 52.6 kg, SpO2 99 %.Body mass index is 19.91 kg/m.  General Appearance: Casual  Eye Contact:  Good  Speech:  Normal Rate  Volume:  Normal  Mood:  Anxious, mild  Affect:  Congruent  Thought Process:  Coherent and Descriptions of Associations: Intact  Orientation:  Full (Time, Place, and Person)  Thought Content:  WDL and Logical  Suicidal Thoughts:  No  Homicidal Thoughts:  No  Memory:  Immediate;   Good Recent;   Good Remote;   Good  Judgement:  Fair  Insight:  Fair  Psychomotor Activity:  Decreased  Concentration:  Concentration: Good and Attention Span: Good  Recall:  Good  Fund of Knowledge:  Good  Language:  Good  Akathisia:  No  Handed:  Right  AIMS (if indicated):     Assets:  Housing Intimacy Leisure Time Physical Health Resilience Social Support  ADL's:  Intact  Cognition:  WNL  Sleep:          Physical Exam: Physical Exam Vitals and nursing note reviewed.  Constitutional:      Appearance: Normal  appearance.  HENT:     Head: Normocephalic.     Nose: Nose normal.  Pulmonary:     Effort: Pulmonary effort is normal.  Musculoskeletal:        General: Normal range of motion.     Cervical back: Normal range of motion.  Neurological:     General: No focal deficit present.     Mental Status: She is alert and oriented to person, place, and time.  Psychiatric:  Attention and Perception: Attention and perception normal.        Mood and Affect: Mood is anxious.        Speech: Speech normal.        Behavior: Behavior normal. Behavior is cooperative.        Thought Content: Thought content normal.        Cognition and Memory: Cognition and memory normal.        Judgment: Judgment normal.   Review of Systems  Constitutional:  Positive for malaise/fatigue.  Musculoskeletal:  Positive for myalgias.  Psychiatric/Behavioral:  Positive for substance abuse. The patient is nervous/anxious.   All other systems reviewed and are negative. Blood pressure (!) 128/95, pulse (!) 106, temperature 98.4 F (36.9 C), temperature source Oral, resp. rate 16, height 5\' 4"  (1.626 m), weight 52.6 kg, SpO2 99 %. Body mass index is 19.91 kg/m.   Demographic Factors:  Adolescent or young adult and Caucasian  Loss Factors: NA  Historical Factors: NA  Risk Reduction Factors:   Sense of responsibility to family, Living with another person, especially a relative, and Positive social support  Continued Clinical Symptoms:  Anxiety, mild  Cognitive Features That Contribute To Risk:  None    Suicide Risk:  Minimal: No identifiable suicidal ideation.  Patients presenting with no risk factors but with morbid ruminations; may be classified as minimal risk based on the severity of the depressive symptoms    Plan Of Care/Follow-up recommendations:  Opiate induced mood disorder: -Refrain from alcohol and drug use -Follow up with outpatient resources -Attend NA and obtain a sponsor Activity:  as  tolerated Diet:  heart healthy diet   Disposition: discharge home , NP 07/25/2021, 10:44 AM

## 2021-07-25 NOTE — ED Notes (Signed)
Vital signs stable. 

## 2021-07-25 NOTE — ED Notes (Signed)
Patient transferred from Triage to room 24 after dressing out and screening for contraband. Report received from [place]. RN including situation, background, assessment and recommendations. Pt oriented to [employer] including Q15 minute rounds as well as Psychologist, counselling for their protection. Patient is alert and oriented, warm and dry in no acute distress. Patient denies SI, HI, and AVH. Pt. Encouraged to let this nurse know if needs arise.

## 2021-07-25 NOTE — ED Notes (Signed)
Lab has now left room, states that they have been unsuccessful at this time to collect blood work. This was same individual who collected blood in triage. Will notify Dr. Derrill Kay at this time.

## 2021-07-25 NOTE — ED Provider Notes (Signed)
[facility] Emergency Department Provider Note   ____________________________________________   I have reviewed the triage vital signs and the nursing notes.   HISTORY  Chief Complaint IVC   History limited by: Not Limited   HPI Lorraine Paul is a 30 y.o. female who presents to the emergency department today under IVC because of concern for suicidal ideation. The patient states that she messed up and used heroin again. She spoke to her mother on the phone, the patient states that she did not have any thoughts of self harm but did tell her mother goodbye. The patient denies any drug use today.    Records reviewed. Per medical record review patient has a history of substance abuse.   Past Medical History:  Diagnosis Date   Ovarian cyst    Skin infection 2016    Patient Active Problem List   Diagnosis Date Noted   Cellulitis and abscess of hand 04/08/2016    Past Surgical History:  Procedure Laterality Date   I & D EXTREMITY Left 04/08/2016   Procedure: IRRIGATION AND DEBRIDEMENT OF HAND;  Surgeon: Knute Neu, MD;  Location: [facility] OR;  Service: Plastics;  Laterality: Left;   OVARIAN CYST REMOVAL      Prior to Admission medications   Medication Sig Start Date End Date Taking? Authorizing Provider  alprazolam Prudy Feeler) 2 MG tablet Take 1-2 mg by mouth 3 (three) times daily as needed for anxiety.     [provider]  Asenapine Maleate 10 MG SUBL Place 1 each under the tongue daily.    [provider]  metroNIDAZOLE (FLAGYL) 500 MG tablet Take 1 tablet (500 mg total) by mouth 2 (two) times daily. 10/23/19   Gailen Shelter, PA    Allergies Tylenol [acetaminophen]  No family history on file.  Social History Social History   Tobacco Use   Smoking status: Every Day    Packs/day: 0.25    Types: Cigarettes   Smokeless tobacco: Never  Substance Use Topics   Alcohol use: Yes    Comment: rarely   Drug use: No    Review of  Systems Constitutional: No fever/chills Eyes: No visual changes. ENT: No sore throat. Cardiovascular: Denies chest pain. Respiratory: Denies shortness of breath. Gastrointestinal: No abdominal pain.  No nausea, no vomiting.  No diarrhea.   Genitourinary: Negative for dysuria. Musculoskeletal: Negative for back pain. Skin: Negative for rash. Neurological: Negative for headaches, focal weakness or numbness.  ____________________________________________   PHYSICAL EXAM:  VITAL SIGNS: ED Triage Vitals  Enc Vitals Group     BP 07/24/21 2108 (!) 128/95     Pulse Rate 07/24/21 2108 (!) 106     Resp 07/24/21 2108 16     Temp 07/24/21 2108 98.4 F (36.9 C)     Temp Source 07/24/21 2108 Oral     SpO2 07/24/21 2108 99 %     Weight 07/24/21 2105 116 lb (52.6 kg)     Height 07/24/21 2105 5\' 4"  (1.626 m)     Head Circumference --      Peak Flow --      Pain Score 07/24/21 2105 0   Constitutional: Alert and oriented.  Eyes: Conjunctivae are normal.  ENT      Head: Normocephalic and atraumatic.      Nose: No congestion/rhinnorhea.      Mouth/Throat: Mucous membranes are moist.      Neck: No stridor. Hematological/Lymphatic/Immunilogical: No cervical lymphadenopathy. Cardiovascular: Normal rate, regular rhythm.  No murmurs,  rubs, or gallops.  Respiratory: Normal respiratory effort without tachypnea nor retractions. Breath sounds are clear and equal bilaterally. No wheezes/rales/rhonchi. Gastrointestinal: Soft and non tender. No rebound. No guarding.  Genitourinary: Deferred Musculoskeletal: Normal range of motion in all extremities. No lower extremity edema. Neurologic:  Normal speech and language. No gross focal neurologic deficits are appreciated.  Skin:  Skin is warm, dry and intact. No rash noted. Psychiatric: Mood and affect are normal. Speech and behavior are normal. Patient exhibits appropriate insight and judgment.  ____________________________________________    LABS  (pertinent positives/negatives)  CBC wbc 10.0, hgb 15.2, plt 252 Acetaminophen, ethanol below threshold  ____________________________________________   EKG  None  ____________________________________________    RADIOLOGY  None  ____________________________________________   PROCEDURES  Procedures  ____________________________________________   INITIAL IMPRESSION / ASSESSMENT AND PLAN / ED COURSE  Pertinent labs & imaging results that were available during my care of the patient were reviewed by me and considered in my medical decision making (see chart for details).   Patient presents to the emergency department today because of concern for suicidal ideation. Patient was brought under IVC. While patient denies SI at this time do have some concerns based on what she states her conversation with her mother was. Will have psychiatry evaluate.  The patient has been placed in psychiatric observation due to the need to provide a safe environment for the patient while obtaining psychiatric consultation and evaluation, as well as ongoing medical and medication management to treat the patient's condition.  The patient has been placed under full IVC at this time.  ____________________________________________   FINAL CLINICAL IMPRESSION(S) / ED DIAGNOSES  Final diagnoses:  Drug use     Note: This dictation was prepared with Dragon dictation. Any transcriptional errors that result from this process are unintentional     Phineas Semen, MD 07/25/21 0151

## 2021-07-25 NOTE — ED Notes (Signed)
Patient verbalizes understanding of discharge instructions. Opportunity for questioning and answers were provided. Armband removed by staff, pt discharged from ED. Belongings given back to pt, walked out to lobby

## 2022-01-08 ENCOUNTER — Emergency Department (HOSPITAL_COMMUNITY)
Admission: EM | Admit: 2022-01-08 | Discharge: 2022-01-08 | Disposition: A | Discharge status: Home / Self Care | Payer: Self-pay | Attending: Emergency Medicine | Admitting: Emergency Medicine

## 2022-01-08 ENCOUNTER — Emergency Department (HOSPITAL_COMMUNITY): Payer: Self-pay

## 2022-01-08 ENCOUNTER — Encounter (HOSPITAL_COMMUNITY): Payer: Self-pay

## 2022-01-08 DIAGNOSIS — O039 Complete or unspecified spontaneous abortion without complication: Secondary | ICD-10-CM | POA: Insufficient documentation

## 2022-01-08 DIAGNOSIS — Z3A01 Less than 8 weeks gestation of pregnancy: Secondary | ICD-10-CM | POA: Insufficient documentation

## 2022-01-08 DIAGNOSIS — N939 Abnormal uterine and vaginal bleeding, unspecified: Secondary | ICD-10-CM

## 2022-01-08 LAB — URINALYSIS, ROUTINE W REFLEX MICROSCOPIC
Bacteria, UA: NONE SEEN
Bilirubin Urine: NEGATIVE
Glucose, UA: NEGATIVE mg/dL
Ketones, ur: NEGATIVE mg/dL
Leukocytes,Ua: NEGATIVE
Nitrite: NEGATIVE
Protein, ur: NEGATIVE mg/dL
Specific Gravity, Urine: 1.012 (ref 1.005–1.030)
pH: 5 (ref 5.0–8.0)

## 2022-01-08 LAB — CBC WITH DIFFERENTIAL/PLATELET
Abs Immature Granulocytes: 0.03 10*3/uL (ref 0.00–0.07)
Basophils Absolute: 0 10*3/uL (ref 0.0–0.1)
Basophils Relative: 0 %
Eosinophils Absolute: 0.2 10*3/uL (ref 0.0–0.5)
Eosinophils Relative: 3 %
HCT: 37.1 % (ref 36.0–46.0)
Hemoglobin: 11.8 g/dL — ABNORMAL LOW (ref 12.0–15.0)
Immature Granulocytes: 0 %
Lymphocytes Relative: 26 %
Lymphs Abs: 2.2 10*3/uL (ref 0.7–4.0)
MCH: 27.4 pg (ref 26.0–34.0)
MCHC: 31.8 g/dL (ref 30.0–36.0)
MCV: 86.3 fL (ref 80.0–100.0)
Monocytes Absolute: 0.7 10*3/uL (ref 0.1–1.0)
Monocytes Relative: 8 %
Neutro Abs: 5.2 10*3/uL (ref 1.7–7.7)
Neutrophils Relative %: 63 %
Platelets: 181 10*3/uL (ref 150–400)
RBC: 4.3 MIL/uL (ref 3.87–5.11)
RDW: 14.3 % (ref 11.5–15.5)
WBC: 8.3 10*3/uL (ref 4.0–10.5)
nRBC: 0 % (ref 0.0–0.2)

## 2022-01-08 LAB — BASIC METABOLIC PANEL
Anion gap: 9 (ref 5–15)
BUN: 20 mg/dL (ref 6–20)
CO2: 26 mmol/L (ref 22–32)
Calcium: 8.4 mg/dL — ABNORMAL LOW (ref 8.9–10.3)
Chloride: 105 mmol/L (ref 98–111)
Creatinine, Ser: 0.74 mg/dL (ref 0.44–1.00)
GFR, Estimated: >60 mL/min (ref >60)
Glucose, Bld: 119 mg/dL — ABNORMAL HIGH (ref 70–99)
Potassium: 3.4 mmol/L — ABNORMAL LOW (ref 3.5–5.1)
Sodium: 140 mmol/L (ref 135–145)

## 2022-01-08 LAB — HCG, QUANTITATIVE, PREGNANCY: hCG, Beta Chain, Quant, S: 3849 m[IU]/mL — ABNORMAL HIGH (ref <5)

## 2022-01-08 LAB — ABO/RH: ABO/RH(D): O NEG

## 2022-01-08 MED ORDER — RHO D IMMUNE GLOBULIN 1500 UNIT/2ML IJ SOSY
300.0000 ug | PREFILLED_SYRINGE | Freq: Once | INTRAMUSCULAR | Status: AC
  Administered 2022-01-08: 300 ug via INTRAVENOUS
  Filled 2022-01-08: qty 2

## 2022-01-08 NOTE — Discharge Instructions (Addendum)
As we discussed based on your presentation, symptoms, and work-up today most likely diagnosis is miscarriage.  You may continue to have some vaginal bleeding as all of the products of conception are cleared from your uterus.  Because there is no visualized yolk sac on ultrasound or evidence of ectopic pregnancy on ultrasound it is critical that you follow-up in 48 hours for repeat beta hCG level to ensure that the miscarriage is completed, you do not have any retained products of conception, or early ectopic pregnancy that may continue to grow over the next few days. ? ?If you have significant increase in vaginal bleeding, worsening pain, lightheadedness, or significant nausea or vomiting please return to the emergency department for further evaluation. ? ?Please use Tylenol or ibuprofen for pain.  You may use 600 mg ibuprofen every 6 hours or 1000 mg of Tylenol every 6 hours.  You may choose to alternate between the 2.  This would be most effective.  Not to exceed 4 g of Tylenol within 24 hours.  Not to exceed 3200 mg ibuprofen 24 hours.' ? ?It was a pleasure taking care of you today. ?

## 2022-01-08 NOTE — ED Triage Notes (Signed)
Pt arrived via POV, states she took pregnancy test this morning, positive. X4 days of vaginal bleeding with clots.  ?

## 2022-01-08 NOTE — ED Provider Triage Note (Signed)
Emergency Medicine Provider Triage Evaluation Note ? ?Lorraine Paul , a 31 y.o. female  was evaluated in triage.  Pt complains of vaginal bleeding x 4 days, cramping, blood clots. Sexually active without protection. Reports she has had one miscarriage previously, no term pregnancy. Denies NVD, fever, chills. Reports truly irregular periods secondary to having received Depo in the past.  Reports that she once went 8 years without a period, has been around 4 years since her last menstrual period.  Reports that this 1 felt different with the clots.  She also reports that her abdomen to be more swollen than normal. ? ?Review of Systems  ?Positive: Vaginal bleeding, clots, cramping ?Negative: NVD, fever, chills ? ?Physical Exam  ?BP 121/78 (BP Location: Left Arm)   Pulse 79   Temp 98 ?F (36.7 ?C) (Oral)   Resp 18   SpO2 100%  ?Gen:   Awake, no distress   ?Resp:  Normal effort  ?MSK:   Moves extremities without difficulty  ?Other:  Possible palpation of fundus of uterus above pelvic brim, no significant tenderness, adnexal mass ? ?Medical Decision Making  ?Medically screening exam initiated at 2:55 PM.  Appropriate orders placed.  Trinadee Verhagen was informed that the remainder of the evaluation will be completed by another provider, this initial triage assessment does not replace that evaluation, and the importance of remaining in the ED until their evaluation is complete. ? ?Workup initiated  ?  ?Olene Floss, PA-C ?01/08/22 1458 ? ?

## 2022-01-08 NOTE — ED Provider Notes (Signed)
[facility]-EMERGENCY DEPT Provider Note   CSN: [id number] Arrival date & time: 01/08/22  1423     History  Chief Complaint  Patient presents with   Possible Pregnancy    Lorraine Paul is a 31 y.o. female Pt complains of vaginal bleeding x 4 days, cramping, blood clots. Sexually active without protection. Reports she has had one miscarriage previously, no term pregnancy. Denies NVD, fever, chills. Reports truly irregular periods secondary to having received Depo in the past.  Reports that she once went 8 years without a period, has been around 4 years since her last menstrual period.  Reports that this one felt different with the clots.  She also reports that her abdomen to be more swollen than normal.  Reports that she has some intermittent cramping but no severe abdominal pain, lightheadedness, nausea, vomiting, fever, chills.  Patient thinks that there may have been some products of conception in the clots that she has passed today.   Possible Pregnancy      Home Medications Prior to Admission medications   Not on File      Allergies    Tylenol [acetaminophen]    Review of Systems   Review of Systems  Genitourinary:  Positive for vaginal bleeding.  All other systems reviewed and are negative.  Physical Exam Updated Vital Signs BP (!) 109/57    Pulse 63    Temp 98 F (36.7 C) (Oral)    Resp 18    SpO2 99%  Physical Exam Vitals and nursing note reviewed.  Constitutional:      General: She is not in acute distress.    Appearance: Normal appearance.  HENT:     Head: Normocephalic and atraumatic.  Eyes:     General:        Right eye: No discharge.        Left eye: No discharge.  Cardiovascular:     Rate and Rhythm: Normal rate and regular rhythm.     Heart sounds: No murmur heard.   No friction rub. No gallop.  Pulmonary:     Effort: Pulmonary effort is normal.     Breath sounds: Normal breath sounds.  Abdominal:     General: Bowel sounds  are normal.     Palpations: Abdomen is soft.     Comments: Some tenderness palpation suprapubic region without rebound, rigidity, guarding, notable mass.  It is possible that I am palpating the uterine fundus slightly above the pelvic brim but it is not clear to me.  No obvious fundus palpated.  Genitourinary:    Comments: Normal appearance of external genitalia, slightly open cervical os with small amount of blood in vaginal vault.  No significant clots noted.  No significant tenderness to palpation, adnexal masses noted on bimanual. Skin:    General: Skin is warm and dry.     Capillary Refill: Capillary refill takes less than 2 seconds.  Neurological:     Mental Status: She is alert and oriented to person, place, and time.  Psychiatric:        Mood and Affect: Mood normal.        Behavior: Behavior normal.    ED Results / Procedures / Treatments   Labs (all labs ordered are listed, but only abnormal results are displayed) Labs Reviewed  CBC WITH DIFFERENTIAL/PLATELET - Abnormal; Notable for the following components:      Result Value   Hemoglobin 11.8 (*)    All other components within normal limits  BASIC METABOLIC PANEL - Abnormal; Notable for the following components:   Potassium 3.4 (*)    Glucose, Bld 119 (*)    Calcium 8.4 (*)    All other components within normal limits  HCG, QUANTITATIVE, PREGNANCY - Abnormal; Notable for the following components:   hCG, Beta Chain, Quant, S 3,849 (*)    All other components within normal limits  URINALYSIS, ROUTINE W REFLEX MICROSCOPIC - Abnormal; Notable for the following components:   Color, Urine STRAW (*)    Hgb urine dipstick MODERATE (*)    All other components within normal limits  ABO/RH    EKG None  Radiology US OB LESS THAN 14 WEEKS WITH OB TRANSVAGINAL  Result Date: 01/08/2022 CLINICAL DATA:  Vaginal bleeding EXAM: OBSTETRIC <14 WK [place] AND TRANSVAGINAL OB US DOPPLER ULTRASOUND OF OVARIES TECHNIQUE: Both transabdominal  and transvaginal ultrasound examinations were performed for complete evaluation of the gestation as well as the maternal uterus, adnexal regions, and pelvic cul-de-sac. Transvaginal technique was performed to assess early pregnancy. Color and duplex Doppler ultrasound was utilized to evaluate blood flow to the ovaries. COMPARISON:  None. FINDINGS: Intrauterine gestational sac: None Yolk sac:  Not seen Embryo:  Not seen Cardiac Activity: Not seen Subchorionic hemorrhage:  None visualized. Maternal uterus/adnexae: There is inhomogeneous echogenicity in endometrial cavity and cervical canal without significant increase in vascularity. There are no dominant adnexal masses. There is vascular flow in both adnexal regions. Trace amount of free fluid is seen in the cul-de-sac. Pulsed Doppler evaluation of both ovaries demonstrates normal appearing low-resistance arterial and venous waveforms. IMPRESSION: There is no demonstrable intrauterine gestational sac. If pregnancy test is positive, differential diagnostic possibilities would include very early IUP or failed gestation or ectopic gestation. Serial HCG estimations and short-term follow-up study as warranted should be considered. Endometrial stripe is thickened measuring up to 16 mm. There is inhomogeneous echogenicity in the endometrial cavity and cervical canal. Possibility of blood products in the endometrial cavity should be considered. Trace amount of free fluid in cul-de-sac seen in the some of the images may suggest recent rupture of ovarian follicle. No dominant adnexal masses are seen. Electronically Signed   By: Ernie Avena M.D.   On: 01/08/2022 16:56    Procedures Procedures    Medications Ordered in ED Medications  rho (d) immune globulin (RHIG/RHOPHYLAC) injection 300 mcg (has no administration in time range)    ED Course/ Medical Decision Making/ A&P                           Medical Decision Making Amount and/or Complexity of Data  Reviewed Labs: ordered. Radiology: ordered.  Risk Prescription drug management.  I discussed this case with my attending physician who cosigned this note including patient's presenting symptoms, physical exam, and planned diagnostics and interventions. Attending physician stated agreement with plan or made changes to plan which were implemented  This patient presents to the ED for concern of vaginal bleeding, irregular periods, positive home pregnancy test, pelvic cramping, this involves an extensive number of treatment options, and is a complaint that carries with it a high risk of complications and morbidity. The emergent differential diagnosis prior to evaluation includes, but is not limited to, spontaneous or threatened miscarriage, ruptured ectopic pregnancy, ruptured ovarian cyst, urinary tract infection, pyelonephritis, nephrolithiasis, abnormal uterine bleeding, dysfunctional uterine bleeding, fibroids, versus other.  This is not an exhaustive differential.   Past Medical History / Co-morbidities: History of abnormal  uterine bleeding, history of ovarian cyst with ovarian cyst removal.,  Previous history of opioid dependence and withdrawal  Additional history: Additional history obtained from patient's partner. External records from outside source obtained and reviewed including emergency department, urgent care, OB/GYN visits.  Physical Exam: Physical exam performed. The pertinent findings include: No significant tenderness palpation of the abdomen, slightly open cervical os, no proximal of conception noted in vaginal vault, small amount of blood noted in vaginal vault.  No active bleeding noted.  Lab Tests: I ordered, and personally interpreted labs.  The pertinent results include: Patient is Rh- we will administer RhoGAM.  She is quantitative beta-hCG of 3849.  We will instruct patient to follow-up for serial hCG.  Her BMP is overall unremarkable with very mild hypokalemia potassium  3.4.  Urinalysis is significant for moderate hemoglobin, no evidence of urinary tract infection.  Her CBC is overall unremarkable with mild anemia hemoglobin 11.8.   Imaging Studies: I ordered imaging studies including ultrasound with transvaginal ultrasound to evaluate for ectopic and early pregnancy. I independently visualized and interpreted imaging which showed no evidence of intrauterine pregnancy, thickened endometrial stripe, versus some retained blood, some minimal free fluid consistent with recent ruptured ovarian cyst, no evidence of ectopic pregnancy noted.. I agree with the radiologist interpretation.   Disposition: After consideration of the diagnostic results and the patients response to treatment, I feel that patient appears stable for discharge.  Discussed her active differential at this time is most likely spontaneous miscarriage, with patient likely passing products of conception and the clots that she saw earlier today, however she may have some retained products, and blood that may have passed over the next few days.  Additionally discussed that since she does have positive hCG and no visualized intrauterine or other pregnancy it is possible that she is having a very early ectopic that is not showing up on ultrasound.  It is important that she follow-up in 48 hours for serial beta hCG.  Patient understands and agrees with plan she is discharged in stable condition at this time.  Extensive return precautions given.   Final Clinical Impression(s) / ED Diagnoses Final diagnoses:  Vaginal bleeding  Spontaneous miscarriage    Rx / DC Orders ED Discharge Orders     None         Olene Floss, PA-C 01/08/22 Cecilie Kicks, MD 01/09/22 [phone]

## 2022-01-11 LAB — RH IG WORKUP (INCLUDES ABO/RH)
ABO/RH(D): O NEG
Antibody Screen: NEGATIVE
Gestational Age(Wks): 10
Unit division: 0
Unit tag comment: 10

## 2022-06-20 ENCOUNTER — Emergency Department: Payer: Medicaid Other

## 2022-06-20 ENCOUNTER — Other Ambulatory Visit: Payer: Self-pay

## 2022-06-20 ENCOUNTER — Emergency Department
Admission: EM | Admit: 2022-06-20 | Discharge: 2022-06-20 | Disposition: A | Discharge status: Home / Self Care | Payer: Medicaid Other | Attending: Student in an Organized Health Care Education/Training Program | Admitting: Student in an Organized Health Care Education/Training Program

## 2022-06-20 DIAGNOSIS — O209 Hemorrhage in early pregnancy, unspecified: Secondary | ICD-10-CM | POA: Diagnosis present

## 2022-06-20 DIAGNOSIS — Z3A01 Less than 8 weeks gestation of pregnancy: Secondary | ICD-10-CM | POA: Insufficient documentation

## 2022-06-20 DIAGNOSIS — O200 Threatened abortion: Secondary | ICD-10-CM | POA: Insufficient documentation

## 2022-06-20 DIAGNOSIS — O4690 Antepartum hemorrhage, unspecified, unspecified trimester: Secondary | ICD-10-CM

## 2022-06-20 LAB — COMPREHENSIVE METABOLIC PANEL
ALT: 14 U/L (ref 0–44)
AST: 16 U/L (ref 15–41)
Albumin: 3.9 g/dL (ref 3.5–5.0)
Alkaline Phosphatase: 90 U/L (ref 38–126)
Anion gap: 6 (ref 5–15)
BUN: 17 mg/dL (ref 6–20)
CO2: 27 mmol/L (ref 22–32)
Calcium: 9.1 mg/dL (ref 8.9–10.3)
Chloride: 104 mmol/L (ref 98–111)
Creatinine, Ser: 0.68 mg/dL (ref 0.44–1.00)
GFR, Estimated: >60 mL/min (ref >60)
Glucose, Bld: 91 mg/dL (ref 70–99)
Potassium: 4.2 mmol/L (ref 3.5–5.1)
Sodium: 137 mmol/L (ref 135–145)
Total Bilirubin: 0.2 mg/dL — ABNORMAL LOW (ref 0.3–1.2)
Total Protein: 7.5 g/dL (ref 6.5–8.1)

## 2022-06-20 LAB — CBC WITH DIFFERENTIAL/PLATELET
Abs Immature Granulocytes: 0.02 10*3/uL (ref 0.00–0.07)
Basophils Absolute: 0 10*3/uL (ref 0.0–0.1)
Basophils Relative: 0 %
Eosinophils Absolute: 0.3 10*3/uL (ref 0.0–0.5)
Eosinophils Relative: 4 %
HCT: 38 % (ref 36.0–46.0)
Hemoglobin: 11.8 g/dL — ABNORMAL LOW (ref 12.0–15.0)
Immature Granulocytes: 0 %
Lymphocytes Relative: 39 %
Lymphs Abs: 2.7 10*3/uL (ref 0.7–4.0)
MCH: 27.2 pg (ref 26.0–34.0)
MCHC: 31.1 g/dL (ref 30.0–36.0)
MCV: 87.6 fL (ref 80.0–100.0)
Monocytes Absolute: 0.6 10*3/uL (ref 0.1–1.0)
Monocytes Relative: 9 %
Neutro Abs: 3.3 10*3/uL (ref 1.7–7.7)
Neutrophils Relative %: 48 %
Platelets: 184 10*3/uL (ref 150–400)
RBC: 4.34 MIL/uL (ref 3.87–5.11)
RDW: 13.5 % (ref 11.5–15.5)
WBC: 7 10*3/uL (ref 4.0–10.5)
nRBC: 0 % (ref 0.0–0.2)

## 2022-06-20 LAB — ANTIBODY SCREEN: Antibody Screen: NEGATIVE

## 2022-06-20 LAB — POC URINE PREG, ED: Preg Test, Ur: POSITIVE — AB

## 2022-06-20 LAB — HCG, QUANTITATIVE, PREGNANCY: hCG, Beta Chain, Quant, S: 4397 m[IU]/mL — ABNORMAL HIGH (ref <5)

## 2022-06-20 LAB — ABO/RH: ABO/RH(D): O NEG

## 2022-06-20 MED ORDER — RHO D IMMUNE GLOBULIN 1500 UNIT/2ML IJ SOSY
300.0000 ug | PREFILLED_SYRINGE | Freq: Once | INTRAMUSCULAR | Status: AC
  Administered 2022-06-20: 300 ug via INTRAMUSCULAR
  Filled 2022-06-20: qty 2

## 2022-06-20 MED ORDER — ACETAMINOPHEN 500 MG PO TABS
1000.0000 mg | ORAL_TABLET | Freq: Once | ORAL | Status: AC
  Administered 2022-06-20: 1000 mg via ORAL
  Filled 2022-06-20: qty 2

## 2022-06-20 NOTE — ED Notes (Addendum)
Labs drawn by lab due to hard stick

## 2022-06-20 NOTE — ED Notes (Signed)
First Nurse Note: Pt to ED via POV. Pt states positive pregnancy test on Thursday. Pt states that today she started bleeding and cramping. Pt is currently in NAD.

## 2022-06-20 NOTE — ED Triage Notes (Signed)
Pt states her feet have been swelling and has breast tenderness x 2 weeks, pt last known period 2.5 months ago. Pt has miscarriage in March. Pt took home pregnancy test this past Thursday that was positive. Pt states vaginal bleeding started yesterday. Pt states she soaking through 2-3 pads a day.

## 2022-06-20 NOTE — ED Provider Notes (Signed)
[facility] Provider Note    Event Date/Time   First MD Initiated Contact with Patient 06/20/22 1620     (approximate)   History   Vaginal Bleeding   HPI  Lorraine Paul is a 31 y.o. female with history of miscarriage about 5 months ago presents to the emergency department for treatment and evaluation of vaginal bleeding after finding out she was pregnant.  She believes that her last normal menstrual cycle was 2-1/2 months ago.  Vaginal bleeding started 2 weeks ago and has progressively increased.  Today, she has passed clots.  She is having some pelvic cramping as well.  No dysuria or fever.  Past Medical History:  Diagnosis Date   Ovarian cyst    Skin infection 2016     Physical Exam   Triage Vital Signs: ED Triage Vitals  Enc Vitals Group     BP 06/20/22 1607 117/71     Pulse Rate 06/20/22 1607 72     Resp 06/20/22 1607 18     Temp 06/20/22 1607 99 F (37.2 C)     Temp Source 06/20/22 1607 Oral     SpO2 06/20/22 1607 100 %     Weight 06/20/22 1608 160 lb (72.6 kg)     Height 06/20/22 1608 5\' 4"  (1.626 m)     Head Circumference --      Peak Flow --      Pain Score 06/20/22 1608 6     Pain Loc --      Pain Edu? --      Excl. in GC? --     Most recent vital signs: Vitals:   06/20/22 1607  BP: 117/71  Pulse: 72  Resp: 18  Temp: 99 F (37.2 C)  SpO2: 100%    General: Awake, no distress.  CV:  Good peripheral perfusion.  Resp:  Normal effort.  Abd:  No distention.  Other:     ED Results / Procedures / Treatments   Labs (all labs ordered are listed, but only abnormal results are displayed) Labs Reviewed  CBC WITH DIFFERENTIAL/PLATELET - Abnormal; Notable for the following components:      Result Value   Hemoglobin 11.8 (*)    All other components within normal limits  COMPREHENSIVE METABOLIC PANEL - Abnormal; Notable for the following components:   Total Bilirubin 0.2 (*)    All other components within normal limits   HCG, QUANTITATIVE, PREGNANCY - Abnormal; Notable for the following components:   hCG, Beta Chain, Quant, S 4,397 (*)    All other components within normal limits  POC URINE PREG, ED - Abnormal; Notable for the following components:   Preg Test, Ur Positive (*)    All other components within normal limits  ABO/RH     EKG     RADIOLOGY  06/22/22 shows pregnancy of unknown classification.  I have independently reviewed and interpreted imaging as well as reviewed report from radiology.  PROCEDURES:  Critical Care performed: No  Procedures   MEDICATIONS ORDERED IN ED:  Medications  rho (d) immune globulin (RHIG/RHOPHYLAC) injection 300 mcg (has no administration in time range)  acetaminophen (TYLENOL) tablet 1,000 mg (1,000 mg Oral Given 06/20/22 1733)     IMPRESSION / MDM / ASSESSMENT AND PLAN / ED COURSE   I reviewed the triage vital signs and the nursing notes.  Differential diagnosis includes, but is not limited to: Spontaneous miscarriage, missed abortion, vaginal bleeding in first trimester pregnancy  Patient's presentation is  most consistent with acute illness / injury with system symptoms.  31 year old female presenting to the emergency department for treatment and evaluation of vaginal bleeding in pregnancy.  See HPI for further details.  Plan will be to get labs including beta hCG and ABO and Rh.  Ultrasound to be performed as well.  Patient aware and agreeable to the plan.  Patient noted to be COVID-negative.  Discussed giving RhoGAM with any Janee Morn, certified midwife on-call.  Given that the patient had a miscarriage a few months ago and likely had RhoGAM as part of that process, question was raised regarding need for an additional dose today.  RhoGAM was recommended and ordered as this is  a new pregnancy. Patient in [place] at this time.   US shows pregnancy too early to classify. Results discussed with patient and s/o. Home care discussed. She is aware she will  receive RhoGAM and then be discharged home. Follow up and return precautions given.     FINAL CLINICAL IMPRESSION(S) / ED DIAGNOSES   Final diagnoses:  Threatened miscarriage  Vaginal bleeding in pregnancy     Rx / DC Orders   ED Discharge Orders     None        Note:  This document was prepared using Dragon voice recognition software and may include unintentional dictation errors.   Chinita Pester, FNP 06/20/22 1941    Willy Eddy, MD 06/20/22 2013

## 2022-06-20 NOTE — ED Provider Notes (Signed)
Pt was seen by another provider - This encounter was opened in error     Herschell Dimes, NP 07/17/22 1307    Willy Eddy, MD 07/23/22 [phone]

## 2022-06-20 NOTE — ED Notes (Signed)
Blood draw attempted in triage without success, pt's nurse notified.

## 2022-06-21 LAB — RHOGAM INJECTION: Unit division: 0

## 2022-07-14 ENCOUNTER — Ambulatory Visit (INDEPENDENT_AMBULATORY_CARE_PROVIDER_SITE_OTHER): Payer: Medicaid Other

## 2022-07-14 VITALS — Wt 155.0 lb

## 2022-07-14 DIAGNOSIS — Z3480 Encounter for supervision of other normal pregnancy, unspecified trimester: Secondary | ICD-10-CM | POA: Insufficient documentation

## 2022-07-14 DIAGNOSIS — Z8639 Personal history of other endocrine, nutritional and metabolic disease: Secondary | ICD-10-CM

## 2022-07-14 DIAGNOSIS — Z369 Encounter for antenatal screening, unspecified: Secondary | ICD-10-CM
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
## 2022-07-19 ENCOUNTER — Encounter (HOSPITAL_COMMUNITY): Payer: Self-pay

## 2022-07-19 ENCOUNTER — Emergency Department (HOSPITAL_COMMUNITY)
Admission: EM | Admit: 2022-07-19 | Discharge: 2022-07-19 | Discharge status: Against Medical Advice | Payer: Commercial Managed Care - HMO | Attending: Emergency Medicine | Admitting: Emergency Medicine

## 2022-07-19 ENCOUNTER — Encounter (HOSPITAL_BASED_OUTPATIENT_CLINIC_OR_DEPARTMENT_OTHER): Payer: Self-pay

## 2022-07-19 ENCOUNTER — Other Ambulatory Visit: Payer: Self-pay

## 2022-07-19 ENCOUNTER — Emergency Department (HOSPITAL_BASED_OUTPATIENT_CLINIC_OR_DEPARTMENT_OTHER)
Admission: EM | Admit: 2022-07-19 | Discharge: 2022-07-20 | Discharge status: Against Medical Advice | Payer: Commercial Managed Care - HMO | Source: Home / Self Care

## 2022-07-19 ENCOUNTER — Emergency Department (HOSPITAL_COMMUNITY): Payer: Commercial Managed Care - HMO

## 2022-07-19 DIAGNOSIS — R0789 Other chest pain: Secondary | ICD-10-CM | POA: Diagnosis not present

## 2022-07-19 DIAGNOSIS — F410 Panic disorder [episodic paroxysmal anxiety] without agoraphobia: Secondary | ICD-10-CM | POA: Insufficient documentation

## 2022-07-19 DIAGNOSIS — Z3A10 10 weeks gestation of pregnancy: Secondary | ICD-10-CM | POA: Insufficient documentation

## 2022-07-19 DIAGNOSIS — R0602 Shortness of breath: Secondary | ICD-10-CM | POA: Insufficient documentation

## 2022-07-19 DIAGNOSIS — R110 Nausea: Secondary | ICD-10-CM | POA: Insufficient documentation

## 2022-07-19 DIAGNOSIS — R079 Chest pain, unspecified: Secondary | ICD-10-CM | POA: Insufficient documentation

## 2022-07-19 DIAGNOSIS — O26891 Other specified pregnancy related conditions, first trimester: Secondary | ICD-10-CM | POA: Insufficient documentation

## 2022-07-19 DIAGNOSIS — Z5321 Procedure and treatment not carried out due to patient leaving prior to being seen by health care provider: Secondary | ICD-10-CM | POA: Insufficient documentation

## 2022-07-19 LAB — CBC
HCT: 38.1 % (ref 36.0–46.0)
HCT: 39.3 % (ref 36.0–46.0)
Hemoglobin: 12 g/dL (ref 12.0–15.0)
Hemoglobin: 12.9 g/dL (ref 12.0–15.0)
MCH: 27.7 pg (ref 26.0–34.0)
MCH: 28 pg (ref 26.0–34.0)
MCHC: 31.5 g/dL (ref 30.0–36.0)
MCHC: 32.8 g/dL (ref 30.0–36.0)
MCV: 88 fL (ref 80.0–100.0)
MCV: 85.4 fL (ref 80.0–100.0)
Platelets: 162 10*3/uL (ref 150–400)
Platelets: 160 10*3/uL (ref 150–400)
RBC: 4.33 MIL/uL (ref 3.87–5.11)
RBC: 4.6 MIL/uL (ref 3.87–5.11)
RDW: 13.8 % (ref 11.5–15.5)
RDW: 14 % (ref 11.5–15.5)
WBC: 6.7 10*3/uL (ref 4.0–10.5)
WBC: 7.9 10*3/uL (ref 4.0–10.5)
nRBC: 0 % (ref 0.0–0.2)
nRBC: 0 % (ref 0.0–0.2)

## 2022-07-19 LAB — BASIC METABOLIC PANEL
Anion gap: 4 — ABNORMAL LOW (ref 5–15)
Anion gap: 11 (ref 5–15)
BUN: 16 mg/dL (ref 6–20)
BUN: 17 mg/dL (ref 6–20)
CO2: 26 mmol/L (ref 22–32)
CO2: 24 mmol/L (ref 22–32)
Calcium: 9.4 mg/dL (ref 8.9–10.3)
Calcium: 9.5 mg/dL (ref 8.9–10.3)
Chloride: 108 mmol/L (ref 98–111)
Chloride: 103 mmol/L (ref 98–111)
Creatinine, Ser: 0.52 mg/dL (ref 0.44–1.00)
Creatinine, Ser: 0.6 mg/dL (ref 0.44–1.00)
GFR, Estimated: >60 mL/min (ref >60)
GFR, Estimated: >60 mL/min (ref >60)
Glucose, Bld: 91 mg/dL (ref 70–99)
Glucose, Bld: 103 mg/dL — ABNORMAL HIGH (ref 70–99)
Potassium: 4.2 mmol/L (ref 3.5–5.1)
Potassium: 4.5 mmol/L (ref 3.5–5.1)
Sodium: 138 mmol/L (ref 135–145)
Sodium: 138 mmol/L (ref 135–145)

## 2022-07-19 LAB — I-STAT BETA HCG BLOOD, ED (MC, WL, AP ONLY): I-stat hCG, quantitative: >2000 m[IU]/mL — ABNORMAL HIGH (ref <5)

## 2022-07-19 LAB — TROPONIN I (HIGH SENSITIVITY)
Troponin I (High Sensitivity): 30 ng/L — ABNORMAL HIGH (ref <18)
Troponin I (High Sensitivity): <2 ng/L (ref <18)

## 2022-07-19 LAB — HCG, QUANTITATIVE, PREGNANCY: hCG, Beta Chain, Quant, S: 116449 m[IU]/mL — ABNORMAL HIGH (ref <5)

## 2022-07-19 NOTE — ED Triage Notes (Signed)
Patient here POV from Home.  Endorses Intermittent CP for approximately 1.5 Weeks. Worsening since.  Patient is [redacted] Weeks Pregnant. Some SOB. Some Nausea.   Seen at another ED but LWBS due to Wait. Advised by PCP to Return for Evaluation.   NAD Noted during Triage. A&Ox4. GCS 15. Ambulatory.

## 2022-07-19 NOTE — ED Triage Notes (Signed)
Patient reports that she has been having intermittent chest pain x 1 week. Patient states that she is [redacted] weeks pregnant.  Patient reports that she takes Methadone from a clinic and states she has been clean x 1 year.

## 2022-07-19 NOTE — ED Provider Triage Note (Signed)
Emergency Medicine Provider Triage Evaluation Note  Lorraine Paul , a 31 y.o. female  was evaluated in triage.  Pt complains of chest pain, pressure, feeling shortness of breath, feeling of panic attacks.  Patient reports that she had a miscarriage at 11 weeks for her last pregnancy and has been dealing with a lot of stress, reporting elevated heart rate, feeling of heart racing, feeling of difficulty breathing.  Patient denies exertional component of her chest pain, nausea, vomiting.  She reports it is throbbing in nature, denies history of acid reflux, burning sensation.  She denies fever, chills, cough.  Review of Systems  Positive: Chest pain, anxiety Negative: Fever, chills, NV  Physical Exam  Ht 5\' 4"  (1.626 m)   Wt 69.9 kg   LMP 04/08/2022 (Approximate)   BMI 26.43 kg/m  Gen:   Awake, no distress   Resp:  Normal effort  MSK:   Moves extremities without difficulty  Other:    Medical Decision Making  Medically screening exam initiated at 12:13 PM.  Appropriate orders placed.  Beena Catano was informed that the remainder of the evaluation will be completed by another provider, this initial triage assessment does not replace that evaluation, and the importance of remaining in the ED until their evaluation is complete.  Workup initiated   Payton Mccallum, Olene Floss 07/19/22 1215

## 2022-07-21 ENCOUNTER — Other Ambulatory Visit: Payer: Medicaid Other

## 2022-07-21 DIAGNOSIS — Z369 Encounter for antenatal screening, unspecified: Secondary | ICD-10-CM

## 2022-07-21 DIAGNOSIS — Z3480 Encounter for supervision of other normal pregnancy, unspecified trimester: Secondary | ICD-10-CM

## 2022-07-23 LAB — CBC/D/PLT+RPR+RH+ABO+RUBIGG...
Basophils Absolute: 0 10*3/uL (ref 0.0–0.2)
Basos: 0 %
EOS (ABSOLUTE): 0.2 10*3/uL (ref 0.0–0.4)
Eos: 3 %
HCV Ab: NONREACTIVE
HIV Screen 4th Generation wRfx: NONREACTIVE
Hematocrit: 37.8 % (ref 34.0–46.6)
Hemoglobin: 12.4 g/dL (ref 11.1–15.9)
Hepatitis B Surface Ag: NEGATIVE
Immature Grans (Abs): 0 10*3/uL (ref 0.0–0.1)
Immature Granulocytes: 0 %
Lymphocytes Absolute: 1.9 10*3/uL (ref 0.7–3.1)
Lymphs: 28 %
MCH: 27.7 pg (ref 26.6–33.0)
MCHC: 32.8 g/dL (ref 31.5–35.7)
MCV: 84 fL (ref 79–97)
Monocytes Absolute: 0.5 10*3/uL (ref 0.1–0.9)
Monocytes: 7 %
Neutrophils Absolute: 4.3 10*3/uL (ref 1.4–7.0)
Neutrophils: 62 %
Platelets: 157 10*3/uL (ref 150–450)
RBC: 4.48 x10E6/uL (ref 3.77–5.28)
RDW: 12.9 % (ref 11.7–15.4)
RPR Ser Ql: NONREACTIVE
Rh Factor: NEGATIVE
Rubella Antibodies, IGG: 14.4 index (ref >0.99)
Varicella zoster IgG: 1463 index (ref >165)
WBC: 6.9 10*3/uL (ref 3.4–10.8)

## 2022-07-23 LAB — AB SCR+ANTIBODY ID: Antibody Screen: POSITIVE — AB

## 2022-07-23 LAB — HCV INTERPRETATION

## 2022-07-27 LAB — MATERNIT 21 PLUS CORE, BLOOD
Fetal Fraction: 5
Result (T21): NEGATIVE
Trisomy 13 (Patau syndrome): NEGATIVE
Trisomy 18 (Edwards syndrome): NEGATIVE
Trisomy 21 (Down syndrome): NEGATIVE

## 2022-07-28 ENCOUNTER — Ambulatory Visit (INDEPENDENT_AMBULATORY_CARE_PROVIDER_SITE_OTHER): Payer: Medicaid Other | Admitting: Licensed Practical Nurse

## 2022-07-28 ENCOUNTER — Encounter: Payer: Self-pay | Admitting: Licensed Practical Nurse

## 2022-07-28 ENCOUNTER — Other Ambulatory Visit (HOSPITAL_COMMUNITY)
Admission: RE | Admit: 2022-07-28 | Discharge: 2022-07-28 | Disposition: A | Discharge status: Home / Self Care | Payer: Commercial Managed Care - HMO | Source: Ambulatory Visit | Attending: Licensed Practical Nurse | Admitting: Licensed Practical Nurse

## 2022-07-28 VITALS — BP 110/60 | Wt 158.0 lb

## 2022-07-28 DIAGNOSIS — Z124 Encounter for screening for malignant neoplasm of cervix: Secondary | ICD-10-CM

## 2022-07-28 DIAGNOSIS — Z0283 Encounter for blood-alcohol and blood-drug test: Secondary | ICD-10-CM

## 2022-07-28 DIAGNOSIS — O0990 Supervision of high risk pregnancy, unspecified, unspecified trimester: Secondary | ICD-10-CM | POA: Diagnosis present
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
## 2022-07-31 LAB — CYTOLOGY - PAP
Chlamydia: NEGATIVE
Comment: NEGATIVE
Comment: NEGATIVE
Comment: NEGATIVE
Comment: NORMAL
Diagnosis: NEGATIVE
High risk HPV: NEGATIVE
Neisseria Gonorrhea: NEGATIVE
Trichomonas: NEGATIVE

## 2022-08-03 LAB — URINE DRUG PANEL 7
Amphetamines, Urine: NEGATIVE ng/mL
Barbiturate Quant, Ur: NEGATIVE ng/mL
Benzodiazepine Quant, Ur: NEGATIVE
Cannabinoid Quant, Ur: NEGATIVE ng/mL
Cocaine (Metab.): NEGATIVE ng/mL
Opiate Quant, Ur: NEGATIVE ng/mL
PCP Quant, Ur: NEGATIVE ng/mL

## 2022-08-04 ENCOUNTER — Telehealth: Payer: Self-pay

## 2022-08-04 LAB — URINE CULTURE

## 2022-08-04 NOTE — Telephone Encounter (Signed)
Pt called triage requesting dental fax be sent to [facility] at [phone] for extractions. Fax sent.

## 2022-08-06 ENCOUNTER — Emergency Department (HOSPITAL_BASED_OUTPATIENT_CLINIC_OR_DEPARTMENT_OTHER)
Admission: EM | Admit: 2022-08-06 | Discharge: 2022-08-06 | Disposition: A | Discharge status: Home / Self Care | Payer: Commercial Managed Care - HMO | Attending: Emergency Medicine | Admitting: Emergency Medicine

## 2022-08-06 ENCOUNTER — Encounter (HOSPITAL_BASED_OUTPATIENT_CLINIC_OR_DEPARTMENT_OTHER): Payer: Self-pay | Admitting: Emergency Medicine

## 2022-08-06 ENCOUNTER — Other Ambulatory Visit: Payer: Self-pay

## 2022-08-06 DIAGNOSIS — Z91040 Latex allergy status: Secondary | ICD-10-CM | POA: Insufficient documentation

## 2022-08-06 DIAGNOSIS — R079 Chest pain, unspecified: Secondary | ICD-10-CM | POA: Insufficient documentation

## 2022-08-06 DIAGNOSIS — R9431 Abnormal electrocardiogram [ECG] [EKG]: Secondary | ICD-10-CM | POA: Diagnosis not present

## 2022-08-06 DIAGNOSIS — R0789 Other chest pain: Secondary | ICD-10-CM | POA: Diagnosis not present

## 2022-08-06 LAB — CBC
HCT: 37.5 % (ref 36.0–46.0)
Hemoglobin: 12.4 g/dL (ref 12.0–15.0)
MCH: 28.1 pg (ref 26.0–34.0)
MCHC: 33.1 g/dL (ref 30.0–36.0)
MCV: 85 fL (ref 80.0–100.0)
Platelets: 155 K/uL (ref 150–400)
RBC: 4.41 MIL/uL (ref 3.87–5.11)
RDW: 14.1 % (ref 11.5–15.5)
WBC: 7.6 K/uL (ref 4.0–10.5)
nRBC: 0 % (ref 0.0–0.2)

## 2022-08-06 LAB — BASIC METABOLIC PANEL
Anion gap: 9 (ref 5–15)
BUN: 15 mg/dL (ref 6–20)
CO2: 23 mmol/L (ref 22–32)
Calcium: 9.6 mg/dL (ref 8.9–10.3)
Chloride: 105 mmol/L (ref 98–111)
Creatinine, Ser: 0.66 mg/dL (ref 0.44–1.00)
GFR, Estimated: >60 mL/min (ref >60)
Glucose, Bld: 112 mg/dL — ABNORMAL HIGH (ref 70–99)
Potassium: 3.9 mmol/L (ref 3.5–5.1)
Sodium: 137 mmol/L (ref 135–145)

## 2022-08-06 LAB — TROPONIN I (HIGH SENSITIVITY)
Troponin I (High Sensitivity): <2 ng/L (ref <18)
Troponin I (High Sensitivity): <2 ng/L (ref <18)

## 2022-08-06 NOTE — ED Triage Notes (Signed)
Pt is [redacted] weeks pregnant. C/o chest pain since yesterday. States she had an elevated trp on 9/11. States she is also anxious because the methadone clinic is trying to decrease her dose.

## 2022-08-06 NOTE — ED Provider Notes (Signed)
[facility] EMERGENCY DEPT Provider Note   CSN: [id number] Arrival date & time: 08/06/22  1314     History Chief Complaint  Patient presents with   Chest Pain    Summer Mccolgan is a 31 y.o. female 13w pregnant on methadone presents to the ER for evaluation of chest pain for the past two days. The patient reports that this feels like her anxiety, and her chest pain started after she was told by her methadone doctor that she was going to be decreased on her dose because of an elevated troponin one time. She denies any nausea, vomiting, shortness of breath, leg swelling, fevers, abdominal pain, palpitations, vaginal discharge, rush of fluid, vaginal bleeding.  She reports that she mainly came here for new troponins and an EKG to bring to her methadone provider.  She reports that she is seeing her OB/GYN and methadone provider for her anxiety as well.   Chest Pain Associated symptoms: no abdominal pain, no cough, no fever, no nausea, no shortness of breath and no vomiting        Home Medications Prior to Admission medications   Medication Sig Start Date End Date Taking? Authorizing Provider  acetaminophen (TYLENOL) 325 MG tablet Take 1 tablet by mouth as needed. 06/07/22   [provider]  methadone (DOLOPHINE) 10 MG/ML solution Take 160 mg by mouth daily. Former Sport and exercise psychologist, Historical, MD  Prenatal MV & Min w/FA-DHA (ONE A DAY PRENATAL) 0.4-25 MG CHEW Chew 1 capsule by mouth daily.    [provider]      Allergies    Other and Latex    Review of Systems   Review of Systems  Constitutional:  Negative for chills and fever.  Respiratory:  Positive for chest tightness. Negative for cough and shortness of breath.   Cardiovascular:  Positive for chest pain.  Gastrointestinal:  Negative for abdominal pain, constipation, diarrhea, nausea and vomiting.  Genitourinary:  Negative for dysuria, hematuria, pelvic pain, vaginal bleeding, vaginal discharge  and vaginal pain.    Physical Exam Updated Vital Signs BP 117/70   Pulse (!) 59   Temp 98.5 F (36.9 C) (Oral)   Resp 18   Ht 5\' 4"  (1.626 m)   Wt 71.7 kg   LMP 04/08/2022 (Approximate)   SpO2 100%   BMI 27.12 kg/m  Physical Exam Vitals and nursing note reviewed.  Constitutional:      General: She is not in acute distress.    Appearance: Normal appearance. She is not ill-appearing or toxic-appearing.  HENT:     Head: Normocephalic and atraumatic.  Eyes:     General: No scleral icterus. Cardiovascular:     Rate and Rhythm: Normal rate and regular rhythm.  Pulmonary:     Effort: Pulmonary effort is normal.     Breath sounds: Normal breath sounds.  Chest:     Chest wall: No tenderness.  Musculoskeletal:        General: No deformity.     Cervical back: Normal range of motion.  Skin:    General: Skin is warm and dry.  Neurological:     General: No focal deficit present.     Mental Status: She is alert. Mental status is at baseline.     ED Results / Procedures / Treatments   Labs (all labs ordered are listed, but only abnormal results are displayed) Labs Reviewed  BASIC METABOLIC PANEL - Abnormal; Notable for the following components:      Result  Value   Glucose, Bld 112 (*)    All other components within normal limits  CBC  TROPONIN I (HIGH SENSITIVITY)  TROPONIN I (HIGH SENSITIVITY)    EKG EKG Interpretation  Date/Time:  Friday August 06 2022 13:19:32 EDT Ventricular Rate:  77 PR Interval:  118 QRS Duration: 96 QT Interval:  382 QTC Calculation: 432 R Axis:   73 Text Interpretation: Normal sinus rhythm Anterior infarct , age undetermined T wave abnormality, consider inferolateral ischemia nonspecific T waves similar to Sept 11 2023 Confirmed by Sherwood Gambler [phone]) on 08/06/2022 7:00:29 PM  Radiology No results found.  Procedures Procedures   Medications Ordered in ED Medications - No data to display  ED Course/ Medical Decision Making/  A&P                           Medical Decision Making  31 year old female presents the emergency department for evaluation of her chest pain.  Differential diagnosis includes was not limited to ACS, arrhythmia, PE, GERD, anxiety, pneumonia, pneumothorax, dissection, aneurysm.  Vital signs show slight bradycardia otherwise unremarkable.  Physical exam as noted above.  At this time, patient reports this feels her typical anxiety and started after a very stressful event.  She reports that she has been very stressed and tearful lately given that her methadone may be decreased as she is concerned for her fetus also being decreased from the dose as well.  I doubt any PE as the patient reports that this feels like her generalized anxiety and she denies any shortness of breath or palpitations.  Doubt any ACS given that she has had recurring undetectable troponins.  Her EKG is abnormal, will send her for cardiology referral.  I doubt any pneumonia or pneumothorax patient again does not have any shortness of breath, fever, cough or cold symptoms.  Story not consistent with any dissection or aneurysm.  Again, patient came here for troponins and EKG to give to her methadone doctor so that she will not be decreased on her dose.  On previous chart evaluation, patient did have an elevated troponin at 30 on 07-19-2022 however since then has had 3 less than 2 troponin measurements, one being on the same day as the 30 was recorded.  This could be lab error, unsure, but given the patient's abnormal EKG, we will still send referral for cardiology.  We discussed the results with the patient.  Discussed the need to follow-up with cardiology.  Strict return precautions and red flag symptoms were discussed.  She verbalized understanding and agrees to the plan.  Patient is stable and being discharged home in good condition.  I discussed this case with my attending physician who cosigned this note including patient's  presenting symptoms, physical exam, and planned diagnostics and interventions. Attending physician stated agreement with plan or made changes to plan which were implemented.   Final Clinical Impression(s) / ED Diagnoses Final diagnoses:  Chest pain, unspecified type  Nonspecific abnormal electrocardiogram (ECG) (EKG)    Rx / DC Orders ED Discharge Orders          Ordered    Ambulatory referral to Cardiology       Comments: If you have not heard from the Cardiology office within the next 72 hours please call [phone].   08/06/22 1918              Sherrell Puller, PA-C 08/06/22 Zandra Abts, MD 08/11/22 [phone]

## 2022-08-06 NOTE — Discharge Instructions (Addendum)
You were seen here today for evaluation of your chest pain.  You did have an abnormal EKG although it was stable from your previous.  Because of this, would like for you to follow-up with a cardiologist.  Have included an ambulatory referral.  If you have not heard back within a few days, please call to schedule an appointment.  I have included a copy of your EKG and your troponin levels as requested.  Please make sure to follow-up with your OB/GYN about being placed on possible anxiety medication.  If you have any chest pain, shortness of breath, palpitations, leg swelling, fevers, vaginal bleeding, vaginal discharge, abdominal pain, nausea, vomiting, please turn to the x-ray department for evaluation.  If you have any concerns, new or worsening symptoms, please return to the nearest emergency department for evaluation.

## 2022-08-09 ENCOUNTER — Telehealth: Payer: Self-pay | Admitting: Licensed Clinical Social Worker

## 2022-08-09 NOTE — Telephone Encounter (Signed)
-----   Message from Lovenia Kim, LCSW sent at 08/09/2022  9:15 AM EDT ----- Regarding: Referral Good morning Estill Bamberg,   I am sending a referral for this member. She states she is experiencing high anxiety during current pregnancy due to miscarriage in March.    Thank you,  Amy

## 2022-08-10 ENCOUNTER — Ambulatory Visit: Payer: Commercial Managed Care - HMO | Attending: Cardiology | Admitting: Cardiology

## 2022-08-10 ENCOUNTER — Encounter: Payer: Self-pay | Admitting: Cardiology

## 2022-08-10 VITALS — BP 90/60 | HR 62 | Ht 64.0 in | Wt 153.0 lb

## 2022-08-10 DIAGNOSIS — R9431 Abnormal electrocardiogram [ECG] [EKG]: Secondary | ICD-10-CM | POA: Diagnosis not present

## 2022-08-10 DIAGNOSIS — F1193 Opioid use, unspecified with withdrawal: Secondary | ICD-10-CM

## 2022-08-10 DIAGNOSIS — R011 Cardiac murmur, unspecified: Secondary | ICD-10-CM | POA: Diagnosis not present

## 2022-08-10 NOTE — Patient Instructions (Signed)
Medication Instructions:  The current medical regimen is effective;  continue present plan and medications.  *If you need a refill on your cardiac medications before your next appointment, please call your pharmacy*   Testing/Procedures: Your physician has requested that you have an echocardiogram. Echocardiography is a painless test that uses sound waves to create images of your heart. It provides your doctor with information about the size and shape of your heart and how well your heart's chambers and valves are working. This procedure takes approximately one hour. There are no restrictions for this procedure.   Follow-Up: At [facility], you and your health needs are our priority.  As part of our continuing mission to provide you with exceptional heart care, we have created designated Provider Care Teams.  These Care Teams include your primary Cardiologist (physician) and Advanced Practice Providers (APPs -  Physician Assistants and Nurse Practitioners) who all work together to provide you with the care you need, when you need it.  We recommend signing up for the patient portal called "MyChart".  Sign up information is provided on this After Visit Summary.  MyChart is used to connect with patients for Virtual Visits (Telemedicine).  Patients are able to view lab/test results, encounter notes, upcoming appointments, etc.  Non-urgent messages can be sent to your provider as well.   To learn more about what you can do with MyChart, go to [email or web address].    Your next appointment:   To be determined after the above testing.   Important Information About Sugar

## 2022-08-10 NOTE — Progress Notes (Signed)
Cardiology Office Note:    Date:  08/10/2022   ID:  Lorraine Paul, DOB September 26, 1991, MRN [id number]  PCP:  Marva Panda, NP   [facility] Providers Cardiologist:  None     Referring MD: Achille Rich, PA-C     History of Present Illness:    Lorraine Paul is a 31 y.o. female with a hx of polysubstance abuse and is currently [redacted]w[redacted]d pregnant presenting to the clinic for evaluation of abnormal EKG results.  EKG demonstrates normal sinus rhythm 62 with T wave abnormality, subtle T wave inversion noted in V3 and V4.  She has been seen multiple times in the ED for chest pain and associated panic attacks in the last x1 month. She was last seen by primary care PA Scott Long to have an EKG done to take to her methadone clinic appointment.   Patient states that she was told that her Methadone needs to be tapered down but she is concerned that this may be dangerous to her from a cardiac perspective as well as her baby.  Patient reports having chest pain and tachycardia only when she has panic attacks.  She has been told that she has a murmur in the past.  She states that her maternal grandmother had heart problems but was severely overweight as well. She denies any other family history of heart disease.    Past Medical History:  Diagnosis Date   Ovarian cyst    Periodontal disease    has had 9 teeth removed   Skin infection 11/08/2014    Past Surgical History:  Procedure Laterality Date   I & D EXTREMITY Left 04/08/2016   Procedure: IRRIGATION AND DEBRIDEMENT OF HAND;  Surgeon: Knute Neu, MD;  Location: [facility] OR;  Service: Plastics;  Laterality: Left;   MULTIPLE TOOTH EXTRACTIONS     9 d/t peridontal disease   OVARIAN CYST REMOVAL     TONSILLECTOMY     WISDOM TOOTH EXTRACTION     three; between 2021 and 2023    Current Medications: Current Meds  Medication Sig   acetaminophen (TYLENOL) 325 MG tablet Take 1 tablet by mouth as needed.   methadone (DOLOPHINE) 10  MG/ML solution Take 170 mg by mouth daily. Former addict   Prenatal MV & Min w/FA-DHA (ONE A DAY PRENATAL) 0.4-25 MG CHEW Chew 1 capsule by mouth daily.     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  Tobacco Use   Smoking status: Every Day    Packs/day: 0.25    Types: Cigarettes   Smokeless tobacco: Never   Tobacco comments:    07/14/22- is down to 2-3 cigs a day  Vaping Use   Vaping Use: Former  Substance and Sexual Activity   Alcohol use: Not Currently    Comment: rarely   Drug use: Not Currently    Types: IV    Comment: former addict   Sexual activity: Yes    Partners: Male    Birth control/protection: None  Other Topics Concern   Not on file  Social History Narrative   Not on file   Social Determinants of Health   Financial Resource Strain: Medium Risk (07/14/2022)   Overall Financial Resource Strain (  CARDIA)    Difficulty of Paying Living Expenses: Somewhat hard  Food Insecurity: Food Insecurity Present (07/14/2022)   Hunger Vital Sign    Worried About Running Out of Food in the Last Year: Often true    Ran Out of Food in the Last Year: Often true  Transportation Needs: No Transportation Needs (07/14/2022)   PRAPARE - Administrator, Civil Service (Medical): No    Lack of Transportation (Non-Medical): No  Physical Activity: Sufficiently Active (07/14/2022)   Exercise Vital Sign    Days of Exercise per Week: 7 days    Minutes of Exercise per Session: 40 min  Stress: No Stress Concern Present (07/14/2022)   [employer] of Occupational Health - Occupational Stress Questionnaire    Feeling of Stress : Not at all  Social Connections: Moderately Isolated (07/14/2022)   Social Connection and Isolation Panel [NHANES]     Frequency of Communication with Friends and Family: Three times a week    Frequency of Social Gatherings with Friends and Family: Not on file    Attends Religious Services: Never    Database administrator or Organizations: No    Attends Engineer, structural: Never    Marital Status: Living with partner     Family History: The patient's family history includes Alcoholism in her paternal grandfather and paternal grandmother; Diabetes in her maternal grandmother; Heart disease in her maternal grandmother; Other in her paternal grandfather.  ROS:   Please see the history of present illness.    All other systems reviewed and are negative.  EKGs/Labs/Other Studies Reviewed:    The following studies were reviewed today:   EKG:  EKG is ordered today.  The ekg ordered today demonstrates sinus rhythm 62bpm T-wave abnormality anterior leads.  Recent Labs: 06/20/2022: ALT 14 08/06/2022: BUN 15; Creatinine, Ser 0.66; Hemoglobin 12.4; Platelets 155; Potassium 3.9; Sodium 137  Recent Lipid Panel No results found for: "CHOL", "TRIG", "HDL", "CHOLHDL", "VLDL", "LDLCALC", "LDLDIRECT"   Risk Assessment/Calculations:                Physical Exam:    VS:  BP 90/60 (BP Location: Left Arm, Patient Position: Sitting, Cuff Size: Normal)   Pulse 62   Ht 5\' 4"  (1.626 m)   Wt 153 lb (69.4 kg)   LMP 04/08/2022 (Approximate)   SpO2 97%   BMI 26.26 kg/m     Wt Readings from Last 3 Encounters:  08/10/22 153 lb (69.4 kg)  08/06/22 158 lb (71.7 kg)  07/28/22 158 lb (71.7 kg)     GEN: Well nourished, well developed in no acute distress HEENT: Normal NECK: No JVD; No carotid bruits LYMPHATICS: No lymphadenopathy CARDIAC: RRR, soft systolic murmur, no rubs gallops RESPIRATORY:  Clear to auscultation without rales, wheezing or rhonchi  ABDOMEN: Soft, non-tender, non-distended MUSCULOSKELETAL:  No edema; No deformity  SKIN: Warm and dry NEUROLOGIC:  Alert and oriented x  3 PSYCHIATRIC:  Normal affect   ASSESSMENT:    1. Nonspecific abnormal electrocardiogram (ECG) (EKG)   2. Murmur   3. Opiate withdrawal (HCC)    PLAN:    In order of problems listed above:  Abnormal EKG Heart murmur -She is not having any active anginal symptoms.  Notes shortness of breath.  No signs of heart failure.  No syncope.  EKG does demonstrate T wave inversion fairly mild in the anterior precordial leads.  This has been demonstrated previously on prior EKGs.  Prior cardiac troponin has been normal  in the emergency department.   I am comfortable with her continuing with her current medication.  I will defer to pain specialist for any specific dosing instructions.  ER notes reviewed from panic attack/high anxiety. - We will check an echocardiogram to ensure/confirm proper structure and function of her heart.  Opiate withdrawal Pregnancy - Per primary team.      Shared Decision Making/Informed Consent     Medication Adjustments/Labs and Tests Ordered: Current medicines are reviewed at length with the patient today.  Concerns regarding medicines are outlined above.  Orders Placed This Encounter  Procedures   EKG 12-Lead   ECHOCARDIOGRAM COMPLETE   No orders of the defined types were placed in this encounter.   Patient Instructions  Medication Instructions:  The current medical regimen is effective;  continue present plan and medications.  *If you need a refill on your cardiac medications before your next appointment, please call your pharmacy*   Testing/Procedures: Your physician has requested that you have an echocardiogram. Echocardiography is a painless test that uses sound waves to create images of your heart. It provides your doctor with information about the size and shape of your heart and how well your heart's chambers and valves are working. This procedure takes approximately one hour. There are no restrictions for this procedure.   Follow-Up: At [facility], you and your health needs are our priority.  As part of our continuing mission to provide you with exceptional heart care, we have created designated Provider Care Teams.  These Care Teams include your primary Cardiologist (physician) and Advanced Practice Providers (APPs -  Physician Assistants and Nurse Practitioners) who all work together to provide you with the care you need, when you need it.  We recommend signing up for the patient portal called "MyChart".  Sign up information is provided on this After Visit Summary.  MyChart is used to connect with patients for Virtual Visits (Telemedicine).  Patients are able to view lab/test results, encounter notes, upcoming appointments, etc.  Non-urgent messages can be sent to your provider as well.   To learn more about what you can do with MyChart, go to [email or web address].    Your next appointment:   To be determined after the above testing.   Important Information About Sugar         This document serves as a record of services personally performed by Candee Furbish, MD. It was created on her behalf by Eugene Gavia, a trained medical scribe. The creation of this record is based on the scribe's personal observations and the provider's statements to them. This document has been checked and approved by the attending provider.  ICandee Furbish, MD, have reviewed all documentation for this visit. The documentation on 08/10/22 for the exam, diagnosis, procedures, and orders are all accurate and complete.    Signed, Candee Furbish, MD  08/10/2022 2:51 PM    [place]

## 2022-08-12 ENCOUNTER — Ambulatory Visit: Payer: Medicaid Other | Admitting: Licensed Clinical Social Worker

## 2022-08-12 DIAGNOSIS — F411 Generalized anxiety disorder: Secondary | ICD-10-CM | POA: Insufficient documentation

## 2022-08-12 DIAGNOSIS — F1121 Opioid dependence, in remission: Secondary | ICD-10-CM

## 2022-08-12 DIAGNOSIS — F1111 Opioid abuse, in remission: Secondary | ICD-10-CM | POA: Insufficient documentation
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
## 2022-08-13 ENCOUNTER — Ambulatory Visit
Admission: RE | Admit: 2022-08-13 | Discharge: 2022-08-13 | Disposition: A | Discharge status: Home / Self Care | Payer: Commercial Managed Care - HMO | Source: Ambulatory Visit | Attending: Licensed Practical Nurse | Admitting: Licensed Practical Nurse

## 2022-08-13 ENCOUNTER — Other Ambulatory Visit: Payer: Self-pay | Admitting: Licensed Practical Nurse

## 2022-08-13 DIAGNOSIS — O0990 Supervision of high risk pregnancy, unspecified, unspecified trimester: Secondary | ICD-10-CM | POA: Insufficient documentation

## 2022-08-18 ENCOUNTER — Ambulatory Visit (HOSPITAL_COMMUNITY): Payer: Commercial Managed Care - HMO | Attending: Cardiology

## 2022-08-18 ENCOUNTER — Telehealth: Payer: Self-pay | Admitting: Cardiology

## 2022-08-18 DIAGNOSIS — R9431 Abnormal electrocardiogram [ECG] [EKG]: Secondary | ICD-10-CM | POA: Insufficient documentation

## 2022-08-18 DIAGNOSIS — R011 Cardiac murmur, unspecified: Secondary | ICD-10-CM | POA: Diagnosis not present

## 2022-08-18 NOTE — Telephone Encounter (Signed)
New message   Patient had echo today.  She came by check out to state that she needs Dr Marlou Porch to send to the methadone clinic a message stating that it is ok to increase her methadone dosage.  She is [redacted] weeks pregnant and patient states that she needs the note sent by this Friday.  Please call pt and let her know this was done.

## 2022-08-19 ENCOUNTER — Telehealth: Payer: Self-pay | Admitting: Cardiology

## 2022-08-19 LAB — ECHOCARDIOGRAM COMPLETE
Area-P 1/2: 3.78 cm2
S' Lateral: 3 cm

## 2022-08-19 NOTE — Telephone Encounter (Signed)
Follow Up:     Patient says the doctor says he needs her Echo results and also wants to know if it is alright to increase her Methadone dose?

## 2022-08-19 NOTE — Telephone Encounter (Signed)
Dr Marlou Porch note was faxed to [facility] on 08/10/22.  Left message for pt this has already been completed.  Requested if she had a different fax number or person's name it needs to go to, to please call back with that information.

## 2022-08-19 NOTE — Telephone Encounter (Signed)
Pt called to report that the [facility] did get our fax from 08/10/22 but now they need the Echo report and Dr Marlou Porch note saying the Echo is okay if it is.... so she can go up on her Methadone... she says she is pregnant and it would be dangerous for the baby if she backs down on the dosage.   I will forward to pam and Dr Marlou Porch for further review.

## 2022-08-20 NOTE — Telephone Encounter (Signed)
Will fax echo report to [facility] once Dr Marlou Porch has read it.  [phone] is fax # to send to.

## 2022-08-23 NOTE — Telephone Encounter (Signed)
Echo results faxed to [facility] as requested.

## 2022-08-24 ENCOUNTER — Telehealth: Payer: Self-pay | Admitting: Cardiology

## 2022-08-24 NOTE — Telephone Encounter (Signed)
Pt is asking if Dr Marlou Porch can write a detailed letter for her to give to her pain mgmt MD at [facility] that it is safe for her to increase her Methadone according to her Echo.   I advised her that he may not be able to write a letter as such with her being pregnant but she says it only has to be related to her heart and not her pregnancy.   I will forward to Dr Marlou Porch and his nurse for review.

## 2022-08-24 NOTE — Telephone Encounter (Signed)
Pt c/o medication issue:  1. Name of Medication: methadone (DOLOPHINE) 10 MG/ML solution  2. How are you currently taking this medication (dosage and times per day)? As prescribed   3. Are you having a reaction (difficulty breathing--STAT)? No   4. What is your medication issue? Patient is calling stating she is currently at her Doctors office requesting a increase to 180 MG's on this medication, due to being pregnant. Doctor's office is needing a letter stating Dr. Marlou Porch is okay with this increase. She states she is unable to leave until they receive it. She would also like a callback to be notified once sent to [phone]. Please advise.

## 2022-08-25 ENCOUNTER — Encounter: Payer: Self-pay | Admitting: *Deleted

## 2022-08-25 NOTE — Telephone Encounter (Signed)
Letter created and faxed to [facility] [phone] message also sent in response to pt's MyChart pt advice request.

## 2022-08-27 ENCOUNTER — Encounter: Payer: Self-pay | Admitting: Advanced Practice Midwife

## 2022-08-27 ENCOUNTER — Ambulatory Visit (INDEPENDENT_AMBULATORY_CARE_PROVIDER_SITE_OTHER): Payer: Medicaid Other | Admitting: Advanced Practice Midwife

## 2022-08-27 VITALS — BP 103/55 | Wt 160.0 lb

## 2022-08-27 DIAGNOSIS — O99322 Drug use complicating pregnancy, second trimester: Secondary | ICD-10-CM

## 2022-08-27 DIAGNOSIS — Z3A15 15 weeks gestation of pregnancy: Secondary | ICD-10-CM

## 2022-08-27 DIAGNOSIS — Z3482 Encounter for supervision of other normal pregnancy, second trimester: Secondary | ICD-10-CM

## 2022-08-27 DIAGNOSIS — O99342 Other mental disorders complicating pregnancy, second trimester: Secondary | ICD-10-CM

## 2022-08-27 DIAGNOSIS — F1191 Opioid use, unspecified, in remission: Secondary | ICD-10-CM

## 2022-08-27 DIAGNOSIS — F411 Generalized anxiety disorder: Secondary | ICD-10-CM

## 2022-08-27 DIAGNOSIS — F410 Panic disorder [episodic paroxysmal anxiety] without agoraphobia: Secondary | ICD-10-CM

## 2022-08-27 DIAGNOSIS — Z369 Encounter for antenatal screening, unspecified: Secondary | ICD-10-CM

## 2022-08-27 NOTE — Patient Instructions (Signed)
Second Trimester of Pregnancy  The second trimester of pregnancy is from week 13 through week 27. This is months 4 through 6 of pregnancy. The second trimester is often a time when you feel your best. Your body has adjusted to being pregnant, and you begin to feel better physically. During the second trimester: Morning sickness has lessened or stopped completely. You may have more energy. You may have an increase in appetite. The second trimester is also a time when the unborn baby (fetus) is growing rapidly. At the end of the sixth month, the fetus may be up to 12 inches long and weigh about 1 pounds. You will likely begin to feel the baby move (quickening) between 16 and 20 weeks of pregnancy. Body changes during your second trimester Your body continues to go through many changes during your second trimester. The changes vary and generally return to normal after the baby is born. Physical changes Your weight will continue to increase. You will notice your lower abdomen bulging out. You may begin to get stretch marks on your hips, abdomen, and breasts. Your breasts will continue to grow and to become tender. Dark spots or blotches (chloasma or mask of pregnancy) may develop on your face. A dark line from your belly button to the pubic area (linea nigra) may appear. You may have changes in your hair. These can include thickening of your hair, rapid growth, and changes in texture. Some people also have hair loss during or after pregnancy, or hair that feels dry or thin. Health changes You may develop headaches. You may have heartburn. You may develop constipation. You may develop hemorrhoids or swollen, bulging veins (varicose veins). Your gums may bleed and may be sensitive to brushing and flossing. You may urinate more often because the fetus is pressing on your bladder. You may have back pain. This is caused by: Weight gain. Pregnancy hormones that are relaxing the joints in your  pelvis. A shift in weight and the muscles that support your balance. Follow these instructions at home: Medicines Follow your health care provider's instructions regarding medicine use. Specific medicines may be either safe or unsafe to take during pregnancy. Do not take any medicines unless approved by your health care provider. Take a prenatal vitamin that contains at least 600 micrograms (mcg) of folic acid. Eating and drinking Eat a healthy diet that includes fresh fruits and vegetables, whole grains, good sources of protein such as meat, eggs, or tofu, and low-fat dairy products. Avoid raw meat and unpasteurized juice, milk, and cheese. These carry germs that can harm you and your baby. You may need to take these actions to prevent or treat constipation: Drink enough fluid to keep your urine pale yellow. Eat foods that are high in fiber, such as beans, whole grains, and fresh fruits and vegetables. Limit foods that are high in fat and processed sugars, such as fried or sweet foods. Activity Exercise only as directed by your health care provider. Most people can continue their usual exercise routine during pregnancy. Try to exercise for 30 minutes at least 5 days a week. Stop exercising if you develop contractions in your uterus. Stop exercising if you develop pain or cramping in the lower abdomen or lower back. Avoid exercising if it is very hot or humid or if you are at a high altitude. Avoid heavy lifting. If you choose to, you may have sex unless your health care provider tells you not to. Relieving pain and discomfort Wear a supportive  bra to prevent discomfort from breast tenderness. Take warm sitz baths to soothe any pain or discomfort caused by hemorrhoids. Use hemorrhoid cream if your health care provider approves. Rest with your legs raised (elevated) if you have leg cramps or low back pain. If you develop varicose veins: Wear support hose as told by your health care  provider. Elevate your feet for 15 minutes, 3-4 times a day. Limit salt in your diet. Safety Wear your seat belt at all times when driving or riding in a car. Talk with your health care provider if someone is verbally or physically abusive to you. Lifestyle Do not use hot tubs, steam rooms, or saunas. Do not douche. Do not use tampons or scented sanitary pads. Avoid cat litter boxes and soil used by cats. These carry germs that can cause birth defects in the baby and possibly loss of the fetus by miscarriage or stillbirth. Do not use herbal remedies, alcohol, illegal drugs, or medicines that are not approved by your health care provider. Chemicals in these products can harm your baby. Do not use any products that contain nicotine or tobacco, such as cigarettes, e-cigarettes, and chewing tobacco. If you need help quitting, ask your health care provider. General instructions During a routine prenatal visit, your health care provider will do a physical exam and other tests. He or she will also discuss your overall health. Keep all follow-up visits. This is important. Ask your health care provider for a referral to a local prenatal education class. Ask for help if you have counseling or nutritional needs during pregnancy. Your health care provider can offer advice or refer you to specialists for help with various needs. Where to find more information American Pregnancy Association: americanpregnancy.org [employer]: https://www.todd-brady.net/ Office on [employer] Health: MightyReward.co.nz Contact a health care provider if you have: A headache that does not go away when you take medicine. Vision changes or you see spots in front of your eyes. Mild pelvic cramps, pelvic pressure, or nagging pain in the abdominal area. Persistent nausea, vomiting, or diarrhea. A bad-smelling vaginal discharge or foul-smelling urine. Pain when you  urinate. Sudden or extreme swelling of your face, hands, ankles, feet, or legs. A fever. Get help right away if you: Have fluid leaking from your vagina. Have spotting or bleeding from your vagina. Have severe abdominal cramping or pain. Have difficulty breathing. Have chest pain. Have fainting spells. Have not felt your baby move for the time period told by your health care provider. Have new or increased pain, swelling, or redness in an arm or leg. Summary The second trimester of pregnancy is from week 13 through week 27 (months 4 through 6). Do not use herbal remedies, alcohol, illegal drugs, or medicines that are not approved by your health care provider. Chemicals in these products can harm your baby. Exercise only as directed by your health care provider. Most people can continue their usual exercise routine during pregnancy. Keep all follow-up visits. This is important. This information is not intended to replace advice given to you by your health care provider. Make sure you discuss any questions you have with your health care provider. Document Revised: 04/02/2020 Document Reviewed: 02/07/2020 Elsevier Patient Education  2023 Else[employer] Inc. Exercise During Pregnancy Exercise is an important part of being healthy for people of all ages. Exercise improves the function of your heart and lungs and helps you maintain strength, flexibility, and a healthy body weight. Exercise also boosts energy levels and elevates  mood. Most women should exercise regularly during pregnancy. Exercise routines may need to change as your pregnancy progresses. In rare cases, women with certain medical conditions or complications may be asked to limit or avoid exercise during pregnancy. Your health care provider will give you information on what will work for you. How does this affect me? Along with maintaining general strength and flexibility, exercising during pregnancy can help: Keep strength in muscles  that are used during labor and childbirth. Decrease low back pain or symptoms of depression. Control weight gain during pregnancy. Reduce the risk of needing insulin if you develop diabetes during pregnancy. Decrease the risk of cesarean delivery. Speed up your recovery after giving birth. Relieve constipation. How does this affect my baby? Exercise can help you have a healthy pregnancy. Exercise does not cause early (premature) birth. It will not cause your baby to weigh less at birth. What exercises can I do? Many exercises are safe for you to do during pregnancy. Do a variety of exercises that safely increase your heart and breathing rates and help you build and maintain muscle strength. Do exercises exactly as told by your health care provider. You may do these exercises: Walking. Swimming. Water aerobics. Riding a stationary bike. Modified yoga or Pilates. Tell your instructor that you are pregnant. Avoid overstretching, and avoid lying on your back for long periods of time. Running or jogging. Choose this type of exercise only if: You ran or jogged regularly before your pregnancy. You can run or jog and still talk in complete sentences. What exercises should I avoid? You may be told to limit high-intensity exercise depending on your level of fitness and whether you exercised regularly before you were pregnant. You can tell that you are exercising at a high intensity if you are breathing much harder and faster and cannot hold a conversation while exercising. You must avoid: Contact sports. Activities that put you at risk for falling on or being hit in the belly, such as downhill skiing, waterskiing, surfing, rock climbing, cycling, gymnastics, and horseback riding. Scuba diving. Skydiving. Hot yoga or hot Pilates. These activities take place in a room that is heated to high temperatures. Jogging or running, unless you jogged or ran regularly before you were pregnant. While jogging or  running, you should always be able to talk in full sentences. Do not run or jog so fast that you are unable to have a conversation. Do not exercise at more than 6,000 feet above sea level (high elevation) if you are not used to exercising at high elevation. How do I exercise in a safe way?  Avoid overheating. Do not exercise in very high temperatures. Wear loose-fitting, breathable clothes. Avoid dehydration. Drink enough fluid before, during, and after exercise to keep your urine pale yellow. Avoid overstretching. Because of hormone changes during pregnancy, it is easy to overstretch muscles, tendons, and ligaments. Start slowly and ask your health care provider to recommend the types of exercise that are safe for you. Do not exercise to lose weight. Wear a sports bra to support your breasts. Avoid standing still or lying flat on your back as much as you can. Follow these instructions at home: Exercise on most days or all days of the week. Try to exercise for 30 minutes a day, 5 days a week, unless your health care provider tells you not to. If you actively exercised before your pregnancy and you are healthy, your health care provider may tell you to continue to do moderate-intensity  to high-intensity exercise. If you are just starting to exercise or did not exercise much before your pregnancy, your health care provider may tell you to do low-intensity to moderate-intensity exercise. Questions to ask your health care provider Is exercise safe for me? What are signs that I should stop exercising? Does my health condition mean that I should not exercise during pregnancy? When should I avoid exercising during pregnancy? Stop exercising and contact a health care provider if: You have any unusual symptoms such as: Mild contractions of the uterus or cramps in the abdomen. A dizzy feeling that does not go away when you rest. Stop exercising and get help right away if: You have any unusual  symptoms such as: Sudden, severe pain in your low back or your belly. Regular, painful contractions of your uterus. Chest pain. Bleeding or fluid leaking from your vagina. Shortness of breath. Headache. Pain and swelling of your calves. Summary Most women should exercise regularly throughout pregnancy. In rare cases, women with certain medical conditions or complications may be asked to limit or avoid exercise during pregnancy. Do not exercise to lose weight during pregnancy. Your health care provider will tell you what level of physical activity is right for you. Stop exercising and contact a health care provider if you have unusual symptoms, such as mild contractions or dizziness. This information is not intended to replace advice given to you by your health care provider. Make sure you discuss any questions you have with your health care provider. Document Revised: 06/11/2020 Document Reviewed: 06/11/2020 Elsevier Patient Education  2023 Elsevier Inc. Eating Plan for Pregnant Women While you are pregnant, your body requires additional nutrition to help support your growing baby. You also have a higher need for some vitamins and minerals, such as folic acid, calcium, iron, and vitamin D. Eating a healthy, well-balanced diet is very important for your health and your baby's health. Your need for extra calories varies over the course of your pregnancy. Pregnancy is divided into three trimesters, with each trimester lasting 3 months. For most women, it is recommended to consume: 150 extra calories a day during the first trimester. 300 extra calories a day during the second trimester. 300 extra calories a day during the third trimester. What are tips for following this plan? Cooking Practice good food safety and cleanliness. Wash your hands before you eat and after you prepare raw meat. Wash all fruits and vegetables well before peeling or eating. Taking these actions can help to prevent  foodborne illnesses that can be very dangerous to your baby, such as listeriosis. Ask your health care provider for more information about listeriosis. Make sure that all meats, poultry, and eggs are cooked to food-safe temperatures or "well-done." Meal planning  Eat a variety of foods (especially fruits and vegetables) to get a full range of vitamins and minerals. Two or more servings of fish are recommended each week in order to get the most benefits from omega-3 fatty acids that are found in seafood. Choose fish that are lower in mercury, such as salmon and pollock. Limit your overall intake of foods that have "empty calories." These are foods that have little nutritional value, such as sweets, desserts, candies, and sugar-sweetened beverages. Drinks that contain caffeine are okay to drink, but it is better to avoid caffeine. Keep your total caffeine intake to less than 200 mg each day (which is 12 oz or 355 mL of coffee, tea, or soda) or the limit as told by your health care  provider. General information Do not try to lose weight or go on a diet during pregnancy. Take a prenatal vitamin to help meet your additional vitamin and mineral needs during pregnancy, specifically for folic acid, iron, calcium, and vitamin D. Remember to stay active. Ask your health care provider what types of exercise and activities are safe for you. What does 150 extra calories look like? Healthy options that provide 150 extra calories each day could be any of the following: 6-8 oz (170-227 g) plain low-fat yogurt with  cup (70 g) berries. 1 apple with 2 tsp (11 g) peanut butter. Cut-up vegetables with  cup (60 g) hummus. 8 fl oz (237 mL) low-fat chocolate milk. 1 stick of string cheese with 1 medium orange. 1 peanut butter and jelly sandwich that is made with one slice of whole-wheat bread and 1 tsp (5 g) of peanut butter. For 300 extra calories, you could eat two of these healthy options each day. What is a  healthy amount of weight to gain? The right amount of weight gain for you is based on your BMI (body mass index) before you became pregnant. If your BMI was less than 18 (underweight), you should gain 28-40 lb (13-18 kg). If your BMI was 18-24.9 (normal), you should gain 25-35 lb (11-16 kg). If your BMI was 25-29.9 (overweight), you should gain 15-25 lb (7-11 kg). If your BMI was 30 or greater (obese), you should gain 11-20 lb (5-9 kg). What if I am having twins or multiples? Generally, if you are carrying twins or multiples: You may need to eat 300-600 extra calories a day. The recommended range for total weight gain is 25-54 lb (11-25 kg), depending on your BMI before pregnancy. Talk with your health care provider to find out about nutritional needs, weight gain, and exercise that is right for you. What foods should I eat?  Fruits All fruits. Eat a variety of colors and types of fruit. Remember to wash your fruits well before peeling or eating. Vegetables All vegetables. Eat a variety of colors and types of vegetables. Remember to wash your vegetables well before peeling or eating. Grains All grains. Choose whole grains, such as whole-wheat bread, oatmeal, or brown rice. Meats and other protein foods Lean meats, including chicken, [place], and lean cuts of beef, veal, or pork. Fish that is higher in omega-3 fatty acids and lower in mercury, such as salmon, herring, mussels, trout, sardines, pollock, shrimp, crab, and lobster. Tofu. Tempeh. Beans. Eggs. Peanut butter and other nut butters. Dairy Pasteurized milk and milk alternatives, such as almond milk. Pasteurized yogurt and pasteurized cheese. Cottage cheese. Sour cream. Beverages Water. Juices that contain 100% fruit juice or vegetable juice. Caffeine-free teas and decaffeinated coffee. Fats and oils Fats and oils are okay to include in moderation. Sweets and desserts Sweets and desserts are okay to include in moderation. Seasoning  and other foods All pasteurized condiments. The items listed above may not be a complete list of foods and beverages you can eat. Contact a dietitian for more information. What foods should I avoid? Fruits Raw (unpasteurized) fruit juices. Vegetables Unpasteurized vegetable juices. Meats and other protein foods Precooked or cured meat, such as bologna, hot dogs, sausages, or meat loaves. (If you must eat those meats, reheat them until they are steaming hot.) Refrigerated pate, meat spreads from a meat counter, or smoked seafood that is found in the refrigerated section of a store. Raw or undercooked meats, poultry, and eggs. Raw fish, such as sushi  or sashimi. Fish that have high mercury content, such as tilefish, shark, swordfish, and king mackerel. Dairy Unpasteurized milk and any foods that have unpasteurized milk in them. Soft cheeses, such as feta, queso blanco, queso fresco, [place], [place], panela, and blue-veined cheeses (unless they are made with pasteurized milk, which must be stated on the label). Beverages Alcohol. Sugar-sweetened beverages, such as sodas, teas, or energy drinks. Seasoning and other foods Homemade fermented foods and drinks, such as pickles, sauerkraut, or kombucha drinks. (Store-bought pasteurized versions of these are okay.) Salads that are made in a store or deli, such as ham salad, chicken salad, egg salad, tuna salad, and seafood salad. The items listed above may not be a complete list of foods and beverages you should avoid. Contact a dietitian for more information. Where to find more information To calculate the number of calories you need based on your height, weight, and activity level, you can use an online calculator such as: PayStrike.dk To calculate how much weight you should gain during pregnancy, you can use an online pregnancy weight gain calculator such as: http://www.harvey.com/ To learn more about eating fish during pregnancy, talk with  your health care provider or visit: PumpkinSearch.com.ee Summary While you are pregnant, your body requires additional nutrition to help support your growing baby. Eat a variety of foods, especially fruits and vegetables, to get a full range of vitamins and minerals. Practice good food safety and cleanliness. Wash your hands before you eat and after you prepare raw meat. Wash all fruits and vegetables well before peeling or eating. Taking these actions can help to prevent foodborne illnesses, such as listeriosis, that can be very dangerous to your baby. Do not eat raw meat or fish. Do not eat fish that have high mercury content, such as tilefish, shark, swordfish, and king mackerel. Do not eat raw (unpasteurized) dairy. Take a prenatal vitamin to help meet your additional vitamin and mineral needs during pregnancy, specifically for folic acid, iron, calcium, and vitamin D. This information is not intended to replace advice given to you by your health care provider. Make sure you discuss any questions you have with your health care provider. Document Revised: 05/22/2020 Document Reviewed: 05/22/2020 Elsevier Patient Education  2023 Elsevier Inc. Back Pain in Pregnancy Back pain during pregnancy is common. Back pain may be caused by several factors that are related to changes during your pregnancy, including: Your growing baby and uterus. This may result in a changing center of gravity and cause your abdominal muscles to weaken. Pregnancy hormones, which may cause the ligaments of the pelvis to relax. Weight gain during pregnancy. Your posture or position. Follow these instructions at home: Managing pain and stiffness     If directed, put ice on the painful area. To do this: Put ice in a plastic bag. Place a towel between your skin and the bag. Leave the ice on for 20 minutes, 2-3 times per day. Remove the ice if your skin turns bright red. This is very important. If you cannot feel pain, heat, or  cold, you have a greater risk of damage to the area. If directed, apply heat to the affected area before you exercise. Use the heat source that your health care provider recommends, such as a moist heat pack or a heating pad. Place a towel between your skin and the heat source. Leave the heat on for 20-30 minutes. Remove the heat if your skin turns bright red. This is especially important if you are unable to  feel pain, heat, or cold. You may have a greater risk of getting burned. If directed, massage the affected area. Activity Exercise as told by your health care provider. Gentle exercise is the best way to prevent or manage back pain. Listen to your body when lifting. If lifting hurts, ask for help. Squat down when picking up something from the floor. Do not bend over. Squatting uses your leg muscles instead of your back muscles. Only use bed rest for short periods as told by your health care provider. Bed rest should only be used for the most severe episodes of back pain. Standing, sitting, and lying down Do not stand in one place for long periods of time. Use good posture when sitting. Make sure your head rests over your shoulders and is not hanging forward. Use a pillow on your lower back if necessary. Try sleeping on your side with a pregnancy support pillow or one or two regular pillows between your legs. It may be best to sleep on your left side. If you have back pain after a night's rest, your bed may be too soft. A firm mattress may provide more support for your back during pregnancy. General instructions Take over-the-counter and prescription medicines only as told by your health care provider. Use a maternity girdle, elastic sling, or back brace as told by your health care provider. Work with a physical therapist or massage therapist to find ways to manage back pain. Acupuncture or massage therapy may be helpful. Eat a healthy diet. Try to gain weight within your health care  provider's recommendations. Do not wear high heels. Keep all follow-up visits. This is important. Contact a health care provider if: Your back pain interferes with your daily activities. You have back pain on one side of your back. You have increasing pain in other parts of your body. You have numbness, tingling, weakness, or problems with the use of your arms or legs. You have numbness that travels down one or both of your legs. You have nausea, vomiting, or sweating. Get help right away if: You develop any of the following: Shortness of breath, dizziness, or fainting. Severe back pain that is not controlled with medicine. Changes in bowel or bladder control, or you see blood in your urine. You have back pain that is a rhythmic, cramping pain similar to labor pains. Labor pains are usually 2 minutes apart, last for about 1 minute, and involve a bearing down feeling or pressure in your pelvis. You have back pain and your water breaks or you have bleeding from your vagina. Your back pain developed after you fell. These symptoms may represent a serious problem that is an emergency. Do not wait to see if the symptoms will go away. Get medical help right away. Call your local emergency services (911 in the [place] Do not drive yourself to the hospital. Summary Back pain may be caused by several factors that are related to changes during your pregnancy. Follow instructions as told by your health care provider for managing back pain. Take over-the-counter and prescription medicines only as told by your health care provider. Exercise as told by your health care provider. Gentle exercise is the best way to prevent or manage back pain. Keep all follow-up visits. This is important. This information is not intended to replace advice given to you by your health care provider. Make sure you discuss any questions you have with your health care provider. Document Revised: 01/07/2021 Document Reviewed:  01/07/2021 Elsevier Patient Education  2023 Elsevier Inc.  

## 2022-08-27 NOTE — Progress Notes (Signed)
Routine Prenatal Care Visit  Subjective  Lorraine Paul is a 31 y.o. G3P0020 at [redacted]w[redacted]d being seen today for ongoing prenatal care.  She is currently monitored for the following issues for this low-risk pregnancy and has Supervision of other normal pregnancy, antepartum; Opioid use disorder, mild, in sustained remission, on maintenance therapy ([place]); Generalized anxiety disorder W/ panic attacks ; and Panic attack on their problem list.  ----------------------------------------------------------------------------------- Patient reports back pain. Comfort measures reviewed. She asks for a note indicating she needs a higher dose of Methadone as she feels she is going through withdrawals at her current dose. I informed her that I am unable to manage her Methadone treatment and she can speak with one of our doctors regarding the same to see if they are able to help her with this.    . Vag. Bleeding: None.  Movement: Present. Leaking Fluid denies.  ----------------------------------------------------------------------------------- The following portions of the patient's history were reviewed and updated as appropriate: allergies, current medications, past family history, past medical history, past social history, past surgical history and problem list. Problem list updated.  Objective  Blood pressure (!) 103/55, weight 160 lb (72.6 kg), last menstrual period 04/08/2022. Pregravid weight 150 lb (68 kg) Total Weight Gain 10 lb (4.536 kg) Urinalysis: Urine Protein    Urine Glucose    Fetal Status: Fetal Heart Rate (bpm): 166   Movement: Present     General:  Alert, oriented and cooperative. Patient is in no acute distress.  Skin: Skin is warm and dry. No rash noted.   Cardiovascular: Normal heart rate noted  Respiratory: Normal respiratory effort, no problems with respiration noted  Abdomen: Soft, gravid, appropriate for gestational age.       Pelvic:  Cervical exam deferred        Extremities: Normal  range of motion.  Edema: None  Mental Status: Normal mood and affect. Normal behavior. Normal judgment and thought content.   Assessment   31 y.o. G3P0020 at [redacted]w[redacted]d by  02/18/2023, by Ultrasound presenting for routine prenatal visit  Plan   third Problems (from 07/14/22 to present)    No problems associated with this episode.       Preterm labor symptoms and general obstetric precautions including but not limited to vaginal bleeding, contractions, leaking of fluid and fetal movement were reviewed in detail with the patient. Please refer to After Visit Summary for other counseling recommendations.   Return in about 4 weeks (around 09/24/2022) for anatomy scan and rob after.  Rod Can, CNM 08/27/2022 8:58 AM

## 2022-08-30 ENCOUNTER — Telehealth: Payer: Self-pay | Admitting: Licensed Clinical Social Worker

## 2022-08-30 ENCOUNTER — Ambulatory Visit: Payer: Medicaid Other | Admitting: Licensed Clinical Social Worker

## 2022-08-30 NOTE — Telephone Encounter (Signed)
LCSW contacted patient regarding needing to reschedule appointment. Patient declined to reschedule at this time and reports that she will call to reschedule at a later time due to loss of family member. LCSW encouraged patient to reach out.

## 2022-09-23 ENCOUNTER — Ambulatory Visit (INDEPENDENT_AMBULATORY_CARE_PROVIDER_SITE_OTHER): Payer: Medicaid Other | Admitting: Obstetrics

## 2022-09-23 ENCOUNTER — Ambulatory Visit (INDEPENDENT_AMBULATORY_CARE_PROVIDER_SITE_OTHER): Payer: Commercial Managed Care - HMO

## 2022-09-23 VITALS — BP 104/51 | HR 61 | Wt 163.0 lb

## 2022-09-23 DIAGNOSIS — O99342 Other mental disorders complicating pregnancy, second trimester: Secondary | ICD-10-CM

## 2022-09-23 DIAGNOSIS — F1291 Cannabis use, unspecified, in remission: Secondary | ICD-10-CM

## 2022-09-23 DIAGNOSIS — F410 Panic disorder [episodic paroxysmal anxiety] without agoraphobia: Secondary | ICD-10-CM

## 2022-09-23 DIAGNOSIS — Z3A18 18 weeks gestation of pregnancy: Secondary | ICD-10-CM | POA: Diagnosis not present

## 2022-09-23 DIAGNOSIS — Z369 Encounter for antenatal screening, unspecified: Secondary | ICD-10-CM

## 2022-09-23 DIAGNOSIS — Z3482 Encounter for supervision of other normal pregnancy, second trimester: Secondary | ICD-10-CM

## 2022-09-23 DIAGNOSIS — O43119 Circumvallate placenta, unspecified trimester: Secondary | ICD-10-CM

## 2022-09-23 DIAGNOSIS — O0990 Supervision of high risk pregnancy, unspecified, unspecified trimester: Secondary | ICD-10-CM

## 2022-09-23 DIAGNOSIS — O99322 Drug use complicating pregnancy, second trimester: Secondary | ICD-10-CM

## 2022-09-23 DIAGNOSIS — O43112 Circumvallate placenta, second trimester: Secondary | ICD-10-CM

## 2022-09-23 DIAGNOSIS — Z3480 Encounter for supervision of other normal pregnancy, unspecified trimester: Secondary | ICD-10-CM

## 2022-09-23 DIAGNOSIS — F411 Generalized anxiety disorder: Secondary | ICD-10-CM

## 2022-09-23 HISTORY — DX: Circumvallate placenta, unspecified trimester: O43.119

## 2022-09-23 LAB — POCT URINALYSIS DIPSTICK OB
Bilirubin, UA: NEGATIVE
Blood, UA: NEGATIVE
Glucose, UA: NEGATIVE
Ketones, UA: NEGATIVE
Leukocytes, UA: NEGATIVE
Nitrite, UA: NEGATIVE
POC,PROTEIN,UA: NEGATIVE
Spec Grav, UA: 1.015 (ref 1.010–1.025)
Urobilinogen, UA: 0.2 E.U./dL
pH, UA: 6.5 (ref 5.0–8.0)

## 2022-09-23 NOTE — Progress Notes (Signed)
Routine Prenatal Care Visit  Subjective  Lorraine Paul is a 31 y.o. G3P0020 at [redacted]w[redacted]d being seen today for ongoing prenatal care.  She is currently monitored for the following issues for this high-risk pregnancy and has Supervision of other normal pregnancy, antepartum; Opioid use disorder, mild, in sustained remission, on maintenance therapy ([facility]); Generalized anxiety disorder W/ panic attacks ; Panic attack; and Circumvallate placenta on their problem list.  ----------------------------------------------------------------------------------- Patient reports no complaints.  She just had her anatomy scan- has a circumvallate placenta. Contractions: Not present.  .  Movement: Present. Leaking Fluid denies.  ----------------------------------------------------------------------------------- The following portions of the patient's history were reviewed and updated as appropriate: allergies, current medications, past family history, past medical history, past social history, past surgical history and problem list. Problem list updated.  Objective  Blood pressure (!) 104/51, pulse 61, weight 163 lb (73.9 kg), last menstrual period 04/08/2022. Pregravid weight 150 lb (68 kg) Total Weight Gain 13 lb (5.897 kg) Urinalysis: Urine Protein    Urine Glucose    Fetal Status: Fetal Heart Rate (bpm): 133   Movement: Present     General:  Alert, oriented and cooperative. Patient is in no acute distress.  Skin: Skin is warm and dry. No rash noted.   Cardiovascular: Normal heart rate noted  Respiratory: Normal respiratory effort, no problems with respiration noted  Abdomen: Soft, gravid, appropriate for gestational age. Pain/Pressure: Absent     Pelvic:  Cervical exam deferred        Extremities: Normal range of motion.  Edema: Trace  Mental Status: Normal mood and affect. Normal behavior. Normal judgment and thought content.   Assessment   31 y.o. G3P0020 at [redacted]w[redacted]d by  02/18/2023, by Ultrasound presenting for  routine prenatal visit  Plan   third Problems (from 07/14/22 to present)     No problems associated with this episode.        Preterm labor symptoms and general obstetric precautions including but not limited to vaginal bleeding, contractions, leaking of fluid and fetal movement were reviewed in detail with the patient. Please refer to After Visit Summary for other counseling recommendations.  Carefully explained ehr placental issue. She has lengthy hx of heroin use- sober now- has some ongoing anxiety- seeing a therapist. She may request medication at some point. Not sleeping well- advised trail of Doxylamine Succinnate. Sleep hygiene practices reviewed with her.  Return in about 4 weeks (around 10/21/2022) for return OB, check on repeat anatomy scan (circumvallate placenta).  Mirna Mires, CNM  09/23/2022 9:56 AM

## 2022-09-23 NOTE — Addendum Note (Signed)
Addended by: Donnetta Hail on: 09/23/2022 10:16 AM   Modules accepted: Orders

## 2022-10-13 ENCOUNTER — Telehealth: Payer: Self-pay

## 2022-10-13 NOTE — Telephone Encounter (Signed)
Pt calling triage asking for advise. She fell down the stairs and is having abdominal and low back pain and has not felt baby move since then. Before this, she felt movement. Denies any vaginal bleeding/spotting or leakage of fluid. Advised pt to go L&D.

## 2022-10-14 ENCOUNTER — Other Ambulatory Visit: Payer: Self-pay | Admitting: Obstetrics

## 2022-10-14 DIAGNOSIS — Z362 Encounter for other antenatal screening follow-up: Secondary | ICD-10-CM

## 2022-10-21 ENCOUNTER — Ambulatory Visit (INDEPENDENT_AMBULATORY_CARE_PROVIDER_SITE_OTHER): Payer: Commercial Managed Care - HMO | Admitting: Certified Nurse Midwife

## 2022-10-21 ENCOUNTER — Ambulatory Visit (INDEPENDENT_AMBULATORY_CARE_PROVIDER_SITE_OTHER): Payer: Commercial Managed Care - HMO

## 2022-10-21 VITALS — BP 119/74 | HR 71 | Wt 173.4 lb

## 2022-10-21 DIAGNOSIS — Z3A22 22 weeks gestation of pregnancy: Secondary | ICD-10-CM

## 2022-10-21 DIAGNOSIS — Z3A23 23 weeks gestation of pregnancy: Secondary | ICD-10-CM | POA: Diagnosis not present

## 2022-10-21 DIAGNOSIS — Z362 Encounter for other antenatal screening follow-up: Secondary | ICD-10-CM | POA: Diagnosis not present

## 2022-10-21 LAB — POCT URINALYSIS DIPSTICK OB
Bilirubin, UA: NEGATIVE
Blood, UA: NEGATIVE
Glucose, UA: NEGATIVE
Ketones, UA: NEGATIVE
Nitrite, UA: NEGATIVE
POC,PROTEIN,UA: NEGATIVE
Spec Grav, UA: <=1.005 — AB (ref 1.010–1.025)
Urobilinogen, UA: 0.2 E.U./dL
pH, UA: 6.5 (ref 5.0–8.0)

## 2022-10-21 MED ORDER — SERTRALINE HCL 50 MG PO TABS: 50.0000 mg | ORAL_TABLET | Freq: Every day | ORAL | 2 refills | Status: DC

## 2022-10-21 NOTE — Progress Notes (Signed)
Lorraine Paul , pt c/o depression and anxiety that is affecting her daily. She states she is not sleeping well. She is requesting medication to help her manage.  Phq9 24 Gad 21 Encouraged referral for counseling /psychiatrist but pt state she had a bad experience in the past with a counselor and declines at this time.   Discussed use of medication SSRI for depression and anxiety in pregnancy . Mother to baby fact sheet given on risks and benefits of use. Orders placed for zoloft 50 mg po daily.   Discussed self help methods for sleeping,she states that Unisom makes her brake out in rash. Encouraged use of benadryl prn.   Pt states that she call office about 2 wks ago with concern for ctx and was told to go to the ED. She states she went to [facility] and was "turned away" was told that there was noting they could do for her and to go there her OB office. No documentation seen in Epic seen for recent visit to hospital.   Addressed concerns of PTC . She state she has occasional contractions. Encouraged PO hydration and rest should this occur. Discussed signe and symptoms PTL. She verbalizes understanding state she had a miscarriage at 14 wks so she is familiar with what that feels like and the ctx she was having was not that intense. She denies contractions currently.   U./s today for completion of anatomy , preliminary report not yet avaiable.   Follow up 4 wks for growth u/s , 1 hr glucose screen, medication check , and Lorraine Paul.   Philip Aspen, CNM

## 2022-10-21 NOTE — Patient Instructions (Signed)
Round Ligament Pain  The round ligaments are a pair of cord-like tissues that help support the uterus. They can become a source of pain during pregnancy as the ligaments soften and stretch as the baby grows. The pain usually begins in the second trimester (13-28 weeks) of pregnancy, and should only last for a few seconds when it occurs. However, the pain can come and go until the baby is delivered. The pain does not cause harm to the baby. Round ligament pain is usually a short, sharp, and pinching pain, but it can also be a dull, lingering, and aching pain. The pain is felt in the lower side of the abdomen or in the groin. It usually starts deep in the groin and moves up to the outside of the hip area. The pain may happen when you: Suddenly change position, such as quickly going from a sitting to standing position. Do physical activity. Cough or sneeze. Follow these instructions at home: Managing pain  When the pain starts, relax. Then, try any of these methods to help with the pain: Sit down. Flex your knees up to your abdomen. Lie on your side with one pillow under your abdomen and another pillow between your legs. Sit in a warm bath for 15-20 minutes or until the pain goes away. General instructions Watch your condition for any changes. Move slowly when you sit down or stand up. Stop or reduce your physical activities if they cause pain. Avoid long walks if they cause pain. Take over-the-counter and prescription medicines only as told by your health care provider. Keep all follow-up visits. This is important. Contact a health care provider if: Your pain does not go away with treatment. You feel pain in your back that you did not have before. Your medicine is not helping. You have a fever or chills. You have nausea or vomiting. You have diarrhea. You have pain when you urinate. Get help right away if: You have pain that is a rhythmic, cramping pain similar to labor pains. Labor  pains are usually 2 minutes apart, last for about 1 minute, and involve a bearing down feeling or pressure in your pelvis. You have vaginal bleeding. These symptoms may represent a serious problem that is an emergency. Do not wait to see if the symptoms will go away. Get medical help right away. Call your local emergency services (911 in the [place] Do not drive yourself to the hospital. Summary Round ligament pain is felt in the lower abdomen or groin. This pain usually begins in the second trimester (13-28 weeks) and should only last for a few seconds when it occurs. You may notice the pain when you suddenly change position, when you cough or sneeze, or during physical activity. Relaxing, flexing your knees to your abdomen, lying on one side, or taking a warm bath may help to get rid of the pain. Contact your health care provider if the pain does not go away. This information is not intended to replace advice given to you by your health care provider. Make sure you discuss any questions you have with your health care provider. Document Revised: 01/07/2021 Document Reviewed: 01/07/2021 Elsevier Patient Education  2023 Elsevier Inc.  

## 2022-10-30 ENCOUNTER — Encounter: Payer: Self-pay | Admitting: Certified Nurse Midwife

## 2022-11-23 ENCOUNTER — Other Ambulatory Visit: Payer: Self-pay | Admitting: Certified Nurse Midwife

## 2022-11-23 DIAGNOSIS — O0990 Supervision of high risk pregnancy, unspecified, unspecified trimester: Secondary | ICD-10-CM

## 2022-11-23 DIAGNOSIS — Z3A28 28 weeks gestation of pregnancy: Secondary | ICD-10-CM

## 2022-11-24 ENCOUNTER — Other Ambulatory Visit: Payer: Medicaid Other

## 2022-11-24 ENCOUNTER — Other Ambulatory Visit: Payer: Self-pay

## 2022-11-24 ENCOUNTER — Ambulatory Visit (INDEPENDENT_AMBULATORY_CARE_PROVIDER_SITE_OTHER): Payer: Medicaid Other | Admitting: Obstetrics

## 2022-11-24 VITALS — BP 103/65 | HR 77 | Wt 180.0 lb

## 2022-11-24 DIAGNOSIS — Z3A27 27 weeks gestation of pregnancy: Secondary | ICD-10-CM | POA: Diagnosis not present

## 2022-11-24 DIAGNOSIS — O0990 Supervision of high risk pregnancy, unspecified, unspecified trimester: Secondary | ICD-10-CM

## 2022-11-24 DIAGNOSIS — O26892 Other specified pregnancy related conditions, second trimester: Secondary | ICD-10-CM

## 2022-11-24 DIAGNOSIS — Z23 Encounter for immunization: Secondary | ICD-10-CM | POA: Diagnosis not present

## 2022-11-24 DIAGNOSIS — F32A Depression, unspecified: Secondary | ICD-10-CM

## 2022-11-24 DIAGNOSIS — O99342 Other mental disorders complicating pregnancy, second trimester: Secondary | ICD-10-CM

## 2022-11-24 DIAGNOSIS — F418 Other specified anxiety disorders: Secondary | ICD-10-CM

## 2022-11-24 DIAGNOSIS — Z6791 Unspecified blood type, Rh negative: Secondary | ICD-10-CM

## 2022-11-24 DIAGNOSIS — Z3A28 28 weeks gestation of pregnancy: Secondary | ICD-10-CM

## 2022-11-24 DIAGNOSIS — F419 Anxiety disorder, unspecified: Secondary | ICD-10-CM

## 2022-11-24 DIAGNOSIS — O360120 Maternal care for anti-D [Rh] antibodies, second trimester, not applicable or unspecified: Secondary | ICD-10-CM | POA: Diagnosis not present

## 2022-11-24 DIAGNOSIS — F4323 Adjustment disorder with mixed anxiety and depressed mood: Secondary | ICD-10-CM

## 2022-11-24 LAB — POCT URINALYSIS DIPSTICK OB
Bilirubin, UA: NEGATIVE
Blood, UA: NEGATIVE
Glucose, UA: NEGATIVE
Ketones, UA: NEGATIVE
Leukocytes, UA: NEGATIVE
Nitrite, UA: NEGATIVE
POC,PROTEIN,UA: NEGATIVE
Spec Grav, UA: >=1.03 — AB (ref 1.010–1.025)
Urobilinogen, UA: 0.2 E.U./dL
pH, UA: 5 (ref 5.0–8.0)

## 2022-11-24 MED ORDER — RHO D IMMUNE GLOBULIN 1500 UNIT/2ML IJ SOSY
300.0000 ug | PREFILLED_SYRINGE | Freq: Once | INTRAMUSCULAR | Status: AC
  Administered 2022-11-24: 300 ug via INTRAMUSCULAR

## 2022-11-24 MED ORDER — SERTRALINE HCL 50 MG PO TABS: 50.0000 mg | ORAL_TABLET | Freq: Every day | ORAL | 2 refills | Status: DC

## 2022-11-24 NOTE — Progress Notes (Signed)
Routine Prenatal Care Visit  Subjective  Lorraine Paul is a 32 y.o. G3P0020 at [redacted]w[redacted]d being seen today for ongoing prenatal care.  She is currently monitored for the following issues for this high-risk pregnancy and has Supervision of other normal pregnancy, antepartum; Opioid use disorder, mild, in sustained remission, on maintenance therapy ([place]); Generalized anxiety disorder W/ panic attacks ; Panic attack; and Circumvallate placenta on their problem list.  ----------------------------------------------------------------------------------- Patient reports  that she was taking her zoloft 50 mg Bid by mistake. She wants to increase her dosage. Also on Methadone .has been on the zoloft 5 weeks. Having trouble sleeping, but this is a long term issue for her.  Getting glucose testing and Rhogam today. Contractions: Irregular. Vag. Bleeding: None.  Movement: Present. Leaking Fluid denies.  ----------------------------------------------------------------------------------- The following portions of the patient's history were reviewed and updated as appropriate: allergies, current medications, past family history, past medical history, past social history, past surgical history and problem list. Problem list updated.  Objective  Blood pressure 103/65, pulse 77, weight 180 lb (81.6 kg), last menstrual period 04/08/2022. Pregravid weight 150 lb (68 kg) Total Weight Gain 30 lb (13.6 kg) Urinalysis: Urine Protein    Urine Glucose    Fetal Status:     Movement: Present     General:  Alert, oriented and cooperative. Patient is in no acute distress.  Skin: Skin is warm and dry. No rash noted.   Cardiovascular: Normal heart rate noted  Respiratory: Normal respiratory effort, no problems with respiration noted  Abdomen: Soft, gravid, appropriate for gestational age. Pain/Pressure: Present     Pelvic:  Cervical exam deferred        Extremities: Normal range of motion.     Mental Status: Normal mood and  affect. Normal behavior. Normal judgment and thought content.   Assessment   32 y.o. G3P0020 at [redacted]w[redacted]d by  02/18/2023, by Ultrasound presenting for routine prenatal visit  Plan   third Problems (from 07/14/22 to present)     No problems associated with this episode.        Preterm labor symptoms and general obstetric precautions including but not limited to vaginal bleeding, contractions, leaking of fluid and fetal movement were reviewed in detail with the patient. Please refer to After Visit Summary for other counseling recommendations.  Careful discussion regarding the use of SSRIs in addition ot her ongling Methadone. As she wants to increase her dose due to sleep problems, I have suggested we continue on the zoloft 50 q HS for another 2 wks and then re evaluate. GTT today and Rhogam.  Return in about 2 weeks (around 12/08/2022) for return OB.  Imagene Riches, CNM  11/24/2022 10:58 AM

## 2022-11-24 NOTE — Progress Notes (Signed)
[email or web address]- Rhogam, TDAP and BT signed today

## 2022-11-25 ENCOUNTER — Encounter: Payer: Self-pay | Admitting: Obstetrics

## 2022-11-26 ENCOUNTER — Other Ambulatory Visit: Payer: Medicaid Other

## 2022-11-26 DIAGNOSIS — Z3A28 28 weeks gestation of pregnancy: Secondary | ICD-10-CM | POA: Diagnosis not present

## 2022-11-26 DIAGNOSIS — O0990 Supervision of high risk pregnancy, unspecified, unspecified trimester: Secondary | ICD-10-CM | POA: Diagnosis not present

## 2022-11-29 ENCOUNTER — Ambulatory Visit
Admission: RE | Admit: 2022-11-29 | Discharge: 2022-11-29 | Disposition: A | Discharge status: Home / Self Care | Payer: Medicaid Other | Source: Ambulatory Visit | Attending: Obstetrics | Admitting: Obstetrics

## 2022-11-29 DIAGNOSIS — Z3A28 28 weeks gestation of pregnancy: Secondary | ICD-10-CM | POA: Insufficient documentation

## 2022-11-29 DIAGNOSIS — O0990 Supervision of high risk pregnancy, unspecified, unspecified trimester: Secondary | ICD-10-CM | POA: Diagnosis not present

## 2022-11-29 DIAGNOSIS — Z369 Encounter for antenatal screening, unspecified: Secondary | ICD-10-CM | POA: Diagnosis not present

## 2022-12-01 LAB — AB SCR+ANTIBODY ID: Antibody Screen: POSITIVE — AB

## 2022-12-01 LAB — 28 WEEKS RH-PANEL
Basophils Absolute: 0 10*3/uL (ref 0.0–0.2)
Basos: 0 %
EOS (ABSOLUTE): 0.3 10*3/uL (ref 0.0–0.4)
Eos: 2 %
Gestational Diabetes Screen: 99 mg/dL (ref 70–139)
HIV Screen 4th Generation wRfx: NONREACTIVE
Hematocrit: 33.4 % — ABNORMAL LOW (ref 34.0–46.6)
Hemoglobin: 11.4 g/dL (ref 11.1–15.9)
Immature Grans (Abs): 0 10*3/uL (ref 0.0–0.1)
Immature Granulocytes: 0 %
Lymphocytes Absolute: 2.6 10*3/uL (ref 0.7–3.1)
Lymphs: 23 %
MCH: 29.2 pg (ref 26.6–33.0)
MCHC: 34.1 g/dL (ref 31.5–35.7)
MCV: 86 fL (ref 79–97)
Monocytes Absolute: 0.8 10*3/uL (ref 0.1–0.9)
Monocytes: 8 %
Neutrophils Absolute: 7.4 10*3/uL — ABNORMAL HIGH (ref 1.4–7.0)
Neutrophils: 67 %
Platelets: 174 10*3/uL (ref 150–450)
RBC: 3.9 x10E6/uL (ref 3.77–5.28)
RDW: 11.8 % (ref 11.7–15.4)
RPR Ser Ql: NONREACTIVE
WBC: 11.1 10*3/uL — ABNORMAL HIGH (ref 3.4–10.8)

## 2022-12-03 ENCOUNTER — Telehealth: Payer: Self-pay

## 2022-12-03 NOTE — Telephone Encounter (Signed)
Lorraine Paul called triage, stated she swelling in feet, legs, lower arms and fingers. Her blood pressure was 116/75 and blood sugar was 115. Stated her head was fuzzy and doesn't feel right.  She stated she doesn't want to go to the ER, states she's not sleeping good she's going to take a nap and take some tylenol and call us back if she's no better when she wakes up.

## 2022-12-16 ENCOUNTER — Ambulatory Visit (INDEPENDENT_AMBULATORY_CARE_PROVIDER_SITE_OTHER): Payer: Medicaid Other | Admitting: Certified Nurse Midwife

## 2022-12-16 ENCOUNTER — Other Ambulatory Visit (HOSPITAL_COMMUNITY)
Admission: RE | Admit: 2022-12-16 | Discharge: 2022-12-16 | Disposition: A | Discharge status: Home / Self Care | Payer: Medicaid Other | Source: Ambulatory Visit | Attending: Certified Nurse Midwife | Admitting: Certified Nurse Midwife

## 2022-12-16 ENCOUNTER — Encounter: Payer: Self-pay | Admitting: Certified Nurse Midwife

## 2022-12-16 VITALS — BP 123/76 | HR 92 | Wt 184.2 lb

## 2022-12-16 DIAGNOSIS — N898 Other specified noninflammatory disorders of vagina: Secondary | ICD-10-CM | POA: Insufficient documentation

## 2022-12-16 DIAGNOSIS — Z3A30 30 weeks gestation of pregnancy: Secondary | ICD-10-CM

## 2022-12-16 DIAGNOSIS — B3731 Acute candidiasis of vulva and vagina: Secondary | ICD-10-CM | POA: Diagnosis not present

## 2022-12-16 DIAGNOSIS — O43113 Circumvallate placenta, third trimester: Secondary | ICD-10-CM

## 2022-12-16 LAB — POCT URINALYSIS DIPSTICK OB
Bilirubin, UA: NEGATIVE
Blood, UA: NEGATIVE
Glucose, UA: NEGATIVE
Ketones, UA: NEGATIVE
Leukocytes, UA: NEGATIVE
Nitrite, UA: NEGATIVE
POC,PROTEIN,UA: NEGATIVE
Spec Grav, UA: 1.01 (ref 1.010–1.025)
Urobilinogen, UA: 0.2 E.U./dL
pH, UA: 6 (ref 5.0–8.0)

## 2022-12-16 MED ORDER — SERTRALINE HCL 100 MG PO TABS: 100.0000 mg | ORAL_TABLET | Freq: Every day | ORAL | 6 refills | Status: DC

## 2022-12-16 NOTE — Progress Notes (Signed)
ROB feeling good movement, state she has had some spotting since the beginning of the week. States she has been straining to stool recently and feels like that is the cause. She denies that the blood is rectal states it is vaginal and that she is passing " tissue with it". She also notes that that she is having braxton hicks, states that they are irregular. She state she may have one then another one several hours later, she denies that they are regular.  Sterile spec exam , no blood seen in vaginal or cervix. White discharge noted. Swab collected.No tissue noted.  Cervix appears closed. Reassurance given.   U/s order for growth due to hx circumvallate placenta.   Pt requesting to go back to 100 mg of Zoloft state she notes that her mood has declined and that she is having more anxiety.   Phq9 12 Gad 20, order placed to increased zoloft 100 mg daily

## 2022-12-16 NOTE — Addendum Note (Signed)
Addended by: Minette Headland on: 12/16/2022 12:03 PM   Modules accepted: Orders

## 2022-12-16 NOTE — Patient Instructions (Signed)
Round Ligament Pain  The round ligaments are a pair of cord-like tissues that help support the uterus. They can become a source of pain during pregnancy as the ligaments soften and stretch as the baby grows. The pain usually begins in the second trimester (13-28 weeks) of pregnancy, and should only last for a few seconds when it occurs. However, the pain can come and go until the baby is delivered. The pain does not cause harm to the baby. Round ligament pain is usually a short, sharp, and pinching pain, but it can also be a dull, lingering, and aching pain. The pain is felt in the lower side of the abdomen or in the groin. It usually starts deep in the groin and moves up to the outside of the hip area. The pain may happen when you: Suddenly change position, such as quickly going from a sitting to standing position. Do physical activity. Cough or sneeze. Follow these instructions at home: Managing pain  When the pain starts, relax. Then, try any of these methods to help with the pain: Sit down. Flex your knees up to your abdomen. Lie on your side with one pillow under your abdomen and another pillow between your legs. Sit in a warm bath for 15-20 minutes or until the pain goes away. General instructions Watch your condition for any changes. Move slowly when you sit down or stand up. Stop or reduce your physical activities if they cause pain. Avoid long walks if they cause pain. Take over-the-counter and prescription medicines only as told by your health care provider. Keep all follow-up visits. This is important. Contact a health care provider if: Your pain does not go away with treatment. You feel pain in your back that you did not have before. Your medicine is not helping. You have a fever or chills. You have nausea or vomiting. You have diarrhea. You have pain when you urinate. Get help right away if: You have pain that is a rhythmic, cramping pain similar to labor pains. Labor  pains are usually 2 minutes apart, last for about 1 minute, and involve a bearing down feeling or pressure in your pelvis. You have vaginal bleeding. These symptoms may represent a serious problem that is an emergency. Do not wait to see if the symptoms will go away. Get medical help right away. Call your local emergency services (911 in the [place] Do not drive yourself to the hospital. Summary Round ligament pain is felt in the lower abdomen or groin. This pain usually begins in the second trimester (13-28 weeks) and should only last for a few seconds when it occurs. You may notice the pain when you suddenly change position, when you cough or sneeze, or during physical activity. Relaxing, flexing your knees to your abdomen, lying on one side, or taking a warm bath may help to get rid of the pain. Contact your health care provider if the pain does not go away. This information is not intended to replace advice given to you by your health care provider. Make sure you discuss any questions you have with your health care provider. Document Revised: 01/07/2021 Document Reviewed: 01/07/2021 Elsevier Patient Education  2023 Elsevier Inc.  

## 2022-12-17 ENCOUNTER — Encounter: Payer: Self-pay | Admitting: Certified Nurse Midwife

## 2022-12-17 ENCOUNTER — Other Ambulatory Visit: Payer: Self-pay | Admitting: Certified Nurse Midwife

## 2022-12-17 LAB — CERVICOVAGINAL ANCILLARY ONLY
Bacterial Vaginitis (gardnerella): NEGATIVE
Candida Glabrata: NEGATIVE
Candida Vaginitis: POSITIVE — AB
Comment: NEGATIVE
Comment: NEGATIVE
Comment: NEGATIVE

## 2022-12-17 MED ORDER — MICONAZOLE NITRATE 2 % VA CREA: 1.0000 | TOPICAL_CREAM | Freq: Every day | VAGINAL | 0 refills | Status: DC

## 2022-12-20 ENCOUNTER — Other Ambulatory Visit: Payer: Self-pay

## 2022-12-20 DIAGNOSIS — B379 Candidiasis, unspecified: Secondary | ICD-10-CM

## 2022-12-20 MED ORDER — FLUCONAZOLE 150 MG PO TABS: 150.0000 mg | ORAL_TABLET | Freq: Once | ORAL | 0 refills | Status: AC

## 2022-12-28 ENCOUNTER — Ambulatory Visit: Payer: Medicaid Other

## 2022-12-30 ENCOUNTER — Encounter: Payer: Medicaid Other | Admitting: Obstetrics and Gynecology

## 2022-12-30 DIAGNOSIS — O0993 Supervision of high risk pregnancy, unspecified, third trimester: Secondary | ICD-10-CM

## 2022-12-30 DIAGNOSIS — Z3A32 32 weeks gestation of pregnancy: Secondary | ICD-10-CM

## 2022-12-30 DIAGNOSIS — O43113 Circumvallate placenta, third trimester: Secondary | ICD-10-CM

## 2023-01-04 ENCOUNTER — Ambulatory Visit
Admission: RE | Admit: 2023-01-04 | Discharge: 2023-01-04 | Disposition: A | Discharge status: Home / Self Care | Payer: Medicaid Other | Source: Ambulatory Visit | Attending: Certified Nurse Midwife | Admitting: Certified Nurse Midwife

## 2023-01-04 DIAGNOSIS — Z3A33 33 weeks gestation of pregnancy: Secondary | ICD-10-CM | POA: Insufficient documentation

## 2023-01-04 DIAGNOSIS — Z3689 Encounter for other specified antenatal screening: Secondary | ICD-10-CM | POA: Diagnosis not present

## 2023-01-04 DIAGNOSIS — O43113 Circumvallate placenta, third trimester: Secondary | ICD-10-CM | POA: Diagnosis present

## 2023-01-04 DIAGNOSIS — Z3A30 30 weeks gestation of pregnancy: Secondary | ICD-10-CM

## 2023-01-05 ENCOUNTER — Encounter: Payer: Self-pay | Admitting: Obstetrics and Gynecology

## 2023-01-05 ENCOUNTER — Ambulatory Visit (INDEPENDENT_AMBULATORY_CARE_PROVIDER_SITE_OTHER): Payer: Medicaid Other | Admitting: Obstetrics and Gynecology

## 2023-01-05 VITALS — BP 106/73 | HR 73 | Wt 193.6 lb

## 2023-01-05 DIAGNOSIS — F1120 Opioid dependence, uncomplicated: Secondary | ICD-10-CM

## 2023-01-05 DIAGNOSIS — O43113 Circumvallate placenta, third trimester: Secondary | ICD-10-CM

## 2023-01-05 DIAGNOSIS — Z3A33 33 weeks gestation of pregnancy: Secondary | ICD-10-CM

## 2023-01-05 DIAGNOSIS — O0993 Supervision of high risk pregnancy, unspecified, third trimester: Secondary | ICD-10-CM

## 2023-01-05 LAB — POCT URINALYSIS DIPSTICK OB
Bilirubin, UA: NEGATIVE
Blood, UA: NEGATIVE
Glucose, UA: NEGATIVE
Ketones, UA: NEGATIVE
Leukocytes, UA: NEGATIVE
Nitrite, UA: NEGATIVE
POC,PROTEIN,UA: NEGATIVE
Spec Grav, UA: 1.01 (ref 1.010–1.025)
Urobilinogen, UA: 0.2 E.U./dL
pH, UA: 6.5 (ref 5.0–8.0)

## 2023-01-05 NOTE — Progress Notes (Deleted)
ROB: Patient continues to have some anxiety.  She is taking Zoloft.  She is on 190 mg of methadone maintenance.  She says that she has been stable at this dose for many months.  Reports daily fetal movement.  Growth ultrasound yesterday shows baby at 35th percentile (circumvallate placenta).

## 2023-01-05 NOTE — Progress Notes (Signed)
ROB: Patient continues to have some anxiety.  She is taking Zoloft.  She is on 190 mg of methadone maintenance.  She says that she has been stable at this dose for many months.  Reports daily fetal movement.  Growth ultrasound yesterday shows baby at 35th percentile (circumvallate placenta).

## 2023-01-05 NOTE — Progress Notes (Signed)
Lorraine Paul. Patient states daily fetal movement and pelvic pressure. She states feeling braxton hicks. Growth ultrasound yesterday. She states no questions or concerns at this time.

## 2023-01-21 ENCOUNTER — Observation Stay: Payer: Medicaid Other

## 2023-01-21 ENCOUNTER — Observation Stay
Admission: RE | Admit: 2023-01-21 | Discharge: 2023-01-21 | Disposition: A | Discharge status: Home / Self Care | Payer: Medicaid Other | Source: Ambulatory Visit | Attending: Obstetrics | Admitting: Obstetrics

## 2023-01-21 ENCOUNTER — Other Ambulatory Visit: Payer: Self-pay | Admitting: Obstetrics

## 2023-01-21 ENCOUNTER — Other Ambulatory Visit (HOSPITAL_COMMUNITY)
Admission: RE | Admit: 2023-01-21 | Discharge: 2023-01-21 | Disposition: A | Discharge status: Home / Self Care | Payer: Medicaid Other | Source: Ambulatory Visit | Attending: Obstetrics & Gynecology | Admitting: Obstetrics & Gynecology

## 2023-01-21 ENCOUNTER — Ambulatory Visit (INDEPENDENT_AMBULATORY_CARE_PROVIDER_SITE_OTHER): Payer: Medicaid Other | Admitting: Obstetrics & Gynecology

## 2023-01-21 ENCOUNTER — Other Ambulatory Visit: Payer: Self-pay

## 2023-01-21 ENCOUNTER — Encounter: Payer: Self-pay | Admitting: Obstetrics

## 2023-01-21 VITALS — BP 100/60 | Wt 202.0 lb

## 2023-01-21 DIAGNOSIS — O368390 Maternal care for abnormalities of the fetal heart rate or rhythm, unspecified trimester, not applicable or unspecified: Secondary | ICD-10-CM | POA: Diagnosis not present

## 2023-01-21 DIAGNOSIS — Z79899 Other long term (current) drug therapy: Secondary | ICD-10-CM | POA: Diagnosis not present

## 2023-01-21 DIAGNOSIS — O360130 Maternal care for anti-D [Rh] antibodies, third trimester, not applicable or unspecified: Secondary | ICD-10-CM

## 2023-01-21 DIAGNOSIS — O360190 Maternal care for anti-D [Rh] antibodies, unspecified trimester, not applicable or unspecified: Secondary | ICD-10-CM | POA: Insufficient documentation

## 2023-01-21 DIAGNOSIS — O99891 Other specified diseases and conditions complicating pregnancy: Secondary | ICD-10-CM

## 2023-01-21 DIAGNOSIS — O368330 Maternal care for abnormalities of the fetal heart rate or rhythm, third trimester, not applicable or unspecified: Secondary | ICD-10-CM

## 2023-01-21 DIAGNOSIS — O2603 Excessive weight gain in pregnancy, third trimester: Secondary | ICD-10-CM

## 2023-01-21 DIAGNOSIS — Z3A36 36 weeks gestation of pregnancy: Secondary | ICD-10-CM

## 2023-01-21 DIAGNOSIS — O99323 Drug use complicating pregnancy, third trimester: Secondary | ICD-10-CM

## 2023-01-21 DIAGNOSIS — O1203 Gestational edema, third trimester: Secondary | ICD-10-CM | POA: Diagnosis not present

## 2023-01-21 DIAGNOSIS — O0993 Supervision of high risk pregnancy, unspecified, third trimester: Secondary | ICD-10-CM

## 2023-01-21 DIAGNOSIS — Z3490 Encounter for supervision of normal pregnancy, unspecified, unspecified trimester: Secondary | ICD-10-CM

## 2023-01-21 DIAGNOSIS — Z3A00 Weeks of gestation of pregnancy not specified: Secondary | ICD-10-CM | POA: Diagnosis not present

## 2023-01-21 DIAGNOSIS — O4703 False labor before 37 completed weeks of gestation, third trimester: Principal | ICD-10-CM | POA: Insufficient documentation

## 2023-01-21 DIAGNOSIS — F1111 Opioid abuse, in remission: Secondary | ICD-10-CM

## 2023-01-21 HISTORY — DX: Excessive weight gain in pregnancy, third trimester: O26.03

## 2023-01-21 HISTORY — DX: Mental disorder, not otherwise specified: F99

## 2023-01-21 MED ORDER — ACETAMINOPHEN 325 MG PO TABS: 650.0000 mg | ORAL_TABLET | ORAL | Status: DC | PRN

## 2023-01-21 NOTE — OB Triage Note (Signed)
G3P0 presents to L&D triage at [redacted]w[redacted]d for a BPP and NST. She was sent from the office due to low fetal HR ~110. Pt reports good fetal movement and denies vaginal bleeding, pain, or other symptoms. Pt does have b/l LE pitting edema. Monitors applied, pt awaiting [place] for BPP. [place]anson CNM spoke w/ pt.

## 2023-01-21 NOTE — OB Triage Note (Signed)
Pt returned w/ BPP 8/8 and showed reactive NST. Discharge instructions reviewed w/ pt to continue monitoring for fetal movement, contractions, ROM, and vaginal bleeding. Pt understands instructions, sent home w/ SO.

## 2023-01-21 NOTE — OB Triage Note (Signed)
LABOR & DELIVERY OB TRIAGE NOTE  SUBJECTIVE  HPI Lorraine Paul is a 32 y.o. G3P0020 at [redacted]w[redacted]d who presents to Labor & Delivery for fetal monitoring. At her prenatal visit today, her provider reported a FHR of 110 that seemed somewhat irregular. Lorraine Paul reports occasional painful contractions for the past 4 days. She had a BPP 8/8 and RNST in triage. She is currently on methadone therapy.  OB History     Gravida  3   Para  0   Term  0   Preterm  0   AB  2   Living  0      SAB  2   IAB      Ectopic      Multiple      Live Births              Scheduled Meds: Continuous Infusions: PRN Meds:.acetaminophen  OBJECTIVE  BP 119/75 (BP Location: Right Arm)   Pulse 84   Temp 97.9 F (36.6 C) (Oral)   Resp 18   LMP 04/08/2022 (Approximate)   BPP: 8/8  NST I reviewed the NST and it was reactive.  Baseline: 120 Variability: moderate Accelerations: present Decelerations:none Toco: uterine irritability Category 1  ASSESSMENT Impression  1) Pregnancy at G3P0020, [redacted]w[redacted]d, Estimated Date of Delivery: 02/18/23 2) Reassuring maternal/fetal status  PLAN  1) Discharge home with standard labor/return precautions 2) Keep scheduled ROB.  Lloyd Huger, CNM 01/21/23  11:34 AM

## 2023-01-21 NOTE — Progress Notes (Addendum)
   PRENATAL VISIT NOTE  Subjective:  Lorraine Paul is a 32 y.o. G3P0020 at [redacted]w[redacted]d being seen today for ongoing prenatal care.  She is currently monitored for the following issues for this low-risk pregnancy and has Supervision of other normal pregnancy, antepartum; Opioid use disorder, mild, in sustained remission, on maintenance therapy ([place]); Generalized anxiety disorder W/ panic attacks ; Panic attack; Circumvallate placenta; and Anti-D antibodies present during pregnancy on their problem list.  Patient reports Contractions: Regular. Vag. Bleeding: None.  Movement: Present. Denies leaking of fluid.   The following portions of the patient's history were reviewed and updated as appropriate: allergies, current medications, past family history, past medical history, past social history, past surgical history and problem list.   Objective:   Vitals:   01/21/23 0838  BP: 100/60  Weight: 202 lb (91.6 kg)    Fetal Status:     Movement: Present     General:  Alert, oriented and cooperative. Patient is in no acute distress.  Skin: Skin is warm and dry. No rash noted.   Cardiovascular: Normal heart rate noted  Respiratory: Normal respiratory effort, no problems with respiration noted  Abdomen: Soft, gravid, appropriate for gestational age.  Pain/Pressure: Present     Pelvic: Closed/  Extremities: Normal range of motion.     Mental Status: Normal mood and affect. Normal behavior. Normal judgment and thought content.   Assessment and Plan:  Pregnancy: G3P0020 at [redacted]w[redacted]d 1. Supervision of high risk pregnancy in third trimester  - Culture, beta strep (group b only) - Cervicovaginal ancillary only  2. [redacted] weeks gestation of pregnancy  - Culture, beta strep (group b only) - Cervicovaginal ancillary only  3. Opioid use disorder, mild, in sustained remission, on maintenance therapy (HCC) - on methadone  4. Anti-D antibodies present during pregnancy in third trimester, single or unspecified  fetus - titer too low to measure  5. Bradycardia found on measurement of baseline fetal heart rate - to labor and delivery for further evaluation, discussed with on call doctor  6. Circumvallate placenta- monthly growth scans, 35% on 01-04-2023   Preterm labor symptoms and general obstetric precautions including but not limited to vaginal bleeding, contractions, leaking of fluid and fetal movement were reviewed in detail with the patient. Please refer to After Visit Summary for other counseling recommendations.   No follow-ups on file.  Future Appointments  Date Time Provider [place]  01/26/2023  8:15 AM Lurlean Horns, CNM [facility] None  02/03/2023  8:35 AM Harlin Heys, MD [facility] None  02/09/2023  8:35 AM Amalia Hailey Nyoka Lint, MD [facility] None    Emily Filbert, MD

## 2023-01-24 LAB — CERVICOVAGINAL ANCILLARY ONLY
Chlamydia: NEGATIVE
Comment: NEGATIVE
Comment: NORMAL
Neisseria Gonorrhea: NEGATIVE

## 2023-01-25 LAB — CULTURE, BETA STREP (GROUP B ONLY): Strep Gp B Culture: NEGATIVE

## 2023-01-26 ENCOUNTER — Encounter: Payer: Self-pay | Admitting: Obstetrics

## 2023-01-26 ENCOUNTER — Ambulatory Visit (INDEPENDENT_AMBULATORY_CARE_PROVIDER_SITE_OTHER): Payer: Medicaid Other | Admitting: Obstetrics

## 2023-01-26 VITALS — BP 117/74 | HR 91 | Wt 202.0 lb

## 2023-01-26 DIAGNOSIS — Z3A36 36 weeks gestation of pregnancy: Secondary | ICD-10-CM

## 2023-01-26 DIAGNOSIS — F1120 Opioid dependence, uncomplicated: Secondary | ICD-10-CM

## 2023-01-26 DIAGNOSIS — Z3480 Encounter for supervision of other normal pregnancy, unspecified trimester: Secondary | ICD-10-CM

## 2023-01-26 DIAGNOSIS — O0993 Supervision of high risk pregnancy, unspecified, third trimester: Secondary | ICD-10-CM

## 2023-01-26 LAB — POCT URINALYSIS DIPSTICK OB
Bilirubin, UA: NEGATIVE
Blood, UA: NEGATIVE
Glucose, UA: NEGATIVE
Ketones, UA: NEGATIVE
Leukocytes, UA: NEGATIVE
Nitrite, UA: NEGATIVE
POC,PROTEIN,UA: NEGATIVE
Spec Grav, UA: 1.015 (ref 1.010–1.025)
Urobilinogen, UA: 0.2 E.U./dL
pH, UA: 6 (ref 5.0–8.0)

## 2023-01-26 NOTE — Progress Notes (Signed)
Lorraine Paul at [redacted]w[redacted]d. Denies vaginal bleeding, LOF, and regular ctx. Endorses good fetal movement. Lorraine Paul reports that the baby has dropped. She has had periods of having contractions q36 min with lots pelvic/low pack pressure. Reviewed s/s of labor and when to go to the hospital. Discussed NAS and what to expect after birth. Growth scan ordered. RTC in one week.  Lurlean Horns, CNM

## 2023-02-01 ENCOUNTER — Ambulatory Visit
Admission: RE | Admit: 2023-02-01 | Discharge: 2023-02-01 | Disposition: A | Discharge status: Home / Self Care | Payer: Medicaid Other | Source: Ambulatory Visit | Attending: Obstetrics | Admitting: Obstetrics

## 2023-02-01 DIAGNOSIS — Z3689 Encounter for other specified antenatal screening: Secondary | ICD-10-CM | POA: Diagnosis not present

## 2023-02-01 DIAGNOSIS — F1120 Opioid dependence, uncomplicated: Secondary | ICD-10-CM | POA: Diagnosis not present

## 2023-02-01 DIAGNOSIS — Z3A37 37 weeks gestation of pregnancy: Secondary | ICD-10-CM | POA: Diagnosis not present

## 2023-02-03 ENCOUNTER — Telehealth: Payer: Self-pay | Admitting: Obstetrics and Gynecology

## 2023-02-03 ENCOUNTER — Encounter: Payer: Medicaid Other | Admitting: Obstetrics and Gynecology

## 2023-02-03 ENCOUNTER — Ambulatory Visit (INDEPENDENT_AMBULATORY_CARE_PROVIDER_SITE_OTHER): Payer: Medicaid Other

## 2023-02-03 VITALS — BP 107/73 | HR 76 | Wt 210.1 lb

## 2023-02-03 DIAGNOSIS — O0993 Supervision of high risk pregnancy, unspecified, third trimester: Secondary | ICD-10-CM

## 2023-02-03 DIAGNOSIS — O99891 Other specified diseases and conditions complicating pregnancy: Secondary | ICD-10-CM

## 2023-02-03 DIAGNOSIS — M549 Dorsalgia, unspecified: Secondary | ICD-10-CM | POA: Diagnosis not present

## 2023-02-03 DIAGNOSIS — Z3A37 37 weeks gestation of pregnancy: Secondary | ICD-10-CM

## 2023-02-03 DIAGNOSIS — M25472 Effusion, left ankle: Secondary | ICD-10-CM

## 2023-02-03 LAB — POCT URINALYSIS DIPSTICK OB
Bilirubin, UA: NEGATIVE
Blood, UA: NEGATIVE
Glucose, UA: NEGATIVE
Ketones, UA: NEGATIVE
Leukocytes, UA: NEGATIVE
Nitrite, UA: NEGATIVE
POC,PROTEIN,UA: NEGATIVE
Spec Grav, UA: 1.015 (ref 1.010–1.025)
Urobilinogen, UA: 0.2 E.U./dL
pH, UA: 6 (ref 5.0–8.0)

## 2023-02-03 NOTE — Progress Notes (Signed)
Patient presents today after appointment needed to be rescheduled. She states concerns of increasing back pain, difficulty with urination and bilateral ankle edema. We discussed belly bands and back supports, states she has tried these and tylenol with no relief. Reports no pain or odor with urination, it is more positional, UA negative. Discussed resting with heating pad on back only and elevating feet and ankles due to edema. Ultrasound briefly discussed. Appointment scheduled for 02/09/23. Advised to reach out with any further questions or concerns.

## 2023-02-03 NOTE — Telephone Encounter (Signed)
I contacted the patient via phone, I left message about today's appointment needing to be rescheduled due to Dr. Amalia Hailey not be in office due to emergency at the hospital. The patient is scheduled for 4/3 for ROB.

## 2023-02-08 ENCOUNTER — Encounter: Payer: Self-pay | Admitting: Obstetrics

## 2023-02-09 ENCOUNTER — Encounter: Payer: Self-pay | Admitting: Obstetrics and Gynecology

## 2023-02-09 ENCOUNTER — Ambulatory Visit (INDEPENDENT_AMBULATORY_CARE_PROVIDER_SITE_OTHER): Payer: Medicaid Other | Admitting: Obstetrics and Gynecology

## 2023-02-09 VITALS — BP 112/77 | HR 85 | Wt 211.9 lb

## 2023-02-09 DIAGNOSIS — Z3A38 38 weeks gestation of pregnancy: Secondary | ICD-10-CM

## 2023-02-09 DIAGNOSIS — O0993 Supervision of high risk pregnancy, unspecified, third trimester: Secondary | ICD-10-CM

## 2023-02-09 LAB — POCT URINALYSIS DIPSTICK OB
Bilirubin, UA: NEGATIVE
Glucose, UA: NEGATIVE
Ketones, UA: NEGATIVE
Leukocytes, UA: NEGATIVE
Nitrite, UA: NEGATIVE
POC,PROTEIN,UA: NEGATIVE
Spec Grav, UA: 1.02 (ref 1.010–1.025)
Urobilinogen, UA: 0.2 E.U./dL
pH, UA: 5 (ref 5.0–8.0)

## 2023-02-09 NOTE — Progress Notes (Signed)
ROB. Patient states daily fetal movement and increased constant pelvic pain. She reports bilateral lower leg, ankle and foot edema. Patient states seeing a bloody mucous discharge. Would like cervical check.

## 2023-02-09 NOTE — Progress Notes (Signed)
ROB: She is uncomfortable with irregular contractions far apart but strong.  Sometimes wake her up at night.  She has significant lower extremity edema.  I also suspect a large baby based on Leopold's maneuvers.  She has had some mucus and small amount of blood in her discharge likely consistent with mucous plug.

## 2023-02-15 ENCOUNTER — Encounter: Payer: Self-pay | Admitting: Licensed Practical Nurse

## 2023-02-15 ENCOUNTER — Ambulatory Visit (INDEPENDENT_AMBULATORY_CARE_PROVIDER_SITE_OTHER): Payer: Medicaid Other | Admitting: Licensed Practical Nurse

## 2023-02-15 VITALS — BP 119/79 | HR 75 | Wt 218.0 lb

## 2023-02-15 DIAGNOSIS — Z3A39 39 weeks gestation of pregnancy: Secondary | ICD-10-CM

## 2023-02-15 DIAGNOSIS — O0993 Supervision of high risk pregnancy, unspecified, third trimester: Secondary | ICD-10-CM

## 2023-02-15 NOTE — Progress Notes (Signed)
Routine Prenatal Care Visit  Subjective  Lorraine Paul is a 32 y.o. G3P0020 at [redacted]w[redacted]d being seen today for ongoing prenatal care.  She is currently monitored for the following issues for this high-risk pregnancy and has Supervision of other normal pregnancy, antepartum; Opioid use disorder, mild, in sustained remission, on maintenance therapy; Generalized anxiety disorder W/ panic attacks ; Panic attack; Circumvallate placenta; Anti-D antibodies present during pregnancy; Excessive weight gain during pregnancy in third trimester; and Labor and delivery, indication for care on their problem list.  ----------------------------------------------------------------------------------- Patient reports pt is miserable, has significant swelling in her lower extremities, it is causing som much discomfort. Her mood has been irritable because of this discomfort, states her depression is fine. She is not sleeping well. Here with [name]  --Postdates testing reviewed, BPP ordered, IOL scheduled, anticipatory guidance given, understands induction method is bases on cervix, she can expect Cytotec and FB at first.  -S> D [place] on 3/26 shows EFW 3261 grams, 52.4%, AFI 12.6,    Contractions: Not present. Vag. Bleeding: None.  Movement: Present. Leaking Fluid denies.  ----------------------------------------------------------------------------------- The following portions of the patient's history were reviewed and updated as appropriate: allergies, current medications, past family history, past medical history, past social history, past surgical history and problem list. Problem list updated.  Objective  Blood pressure 119/79, pulse 75, weight 218 lb (98.9 kg), last menstrual period 04/08/2022. Pregravid weight 150 lb (68 kg) Total Weight Gain 68 lb (30.8 kg) Urinalysis: Urine Protein    Urine Glucose    Fetal Status: Fetal Heart Rate (bpm): 140 Fundal Height: 41 cm Movement: Present     General:  Alert, oriented and  cooperative. Patient is in no acute distress.  Skin: Skin is warm and dry. No rash noted.   Cardiovascular: Normal heart rate noted  Respiratory: Normal respiratory effort, no problems with respiration noted  Abdomen: Soft, gravid, appropriate for gestational age. Pain/Pressure: Present     Pelvic:  Cervical exam performed Dilation: Closed Effacement (%): Thick Station: -2  Extremities: Normal range of motion.  Edema: Very deep pitting, indentation lasts a long time  Mental Status: Normal mood and affect. Normal behavior. Normal judgment and thought content.   Assessment   32 y.o. G3P0020 at [redacted]w[redacted]d by  02/18/2023, by Ultrasound presenting for routine prenatal visit  Plan   third Problems (from 07/14/22 to present)     No problems associated with this episode.        Term labor symptoms and general obstetric precautions including but not limited to vaginal bleeding, contractions, leaking of fluid and fetal movement were reviewed in detail with the patient. Please refer to After Visit Summary for other counseling recommendations.   Return in about 1 week (around 02/22/2023) for ROB. BPP ordered IOL 4/28 at 0500, orders placed   Carie Caddy, [place]   [facility]  02/15/23  4:38 PM

## 2023-02-17 ENCOUNTER — Encounter: Payer: Self-pay | Admitting: Licensed Practical Nurse

## 2023-02-17 NOTE — Progress Notes (Signed)
  [facility] INDUCTION ASSESSMENT SCHEDULING Lorraine Paul 08/20/1991 Medical record #: [id number] Phone #:  Home Phone [phone]  Mobile [phone]    Prenatal Provider: [facility]OB Delivering Group: [facility] Proposed admission date/time:02/24/2023 0500 Method of induction:Cytotec  Weight:  218  BMI  37.42 HIV Negative HSV Negative EDC Estimated Date of Delivery: 4/12/24based on:US at [redacted] wks  Gestational age on admission: 54 Gravidity/parity:G3P0020  Cervix Score   0 1 2 3   Position Posterior Midposition Anterior   Consistency Firm Medium Soft   Effacement (%) 0-30 40-50 60-70 >80  Dilation (cm) Closed 1-2 3-4 >5  Baby's station -3 -2 -1 +1, +2   Bishop Score:2   Medical induction of labor  select indication(s) below Elective induction ?39 weeks multiparous patient ?39 weeks primiparous patient with Bishop score ?7 ?40 weeks primiparous patient   Medical Indications Adapted from ACOG Committee Opinion #[id number], "Medically Indicated Late Preterm and Early Term Deliveries," 2013.  PLACENTAL / UTERINE ISSUES FETAL ISSUES MATERNAL ISSUES  Placenta previa (36.0-37.6) Isoimmunization (37.0-38.6) Preeclampsia without severe features or gestational HTN (37.0)  Suspected accreta (34.0-35.6) Growth Restriction ([place]) Preeclampsia with severe features (34.0)  Prior classical CD, uterine window, rupture (36.0-37.6) Isolated (38.0-39.6) Chronic HTN (38.0-39.6)  Prior myomectomy (37.0-38.6) Concurrent findings (34.0-37.6) Cholestasis (37.0)  Umbilical vein varix (37.0) Growth Restriction (Twins) Diabetes  Placental abruption (chronic) Di-Di Isolated (36.0-37.6) Pregestational, controlled (39.0)  OBSTETRIC ISSUES Di-Di concurrent findings (32.0-34.6) Pregestational, uncontrolled (37.0-39.0)  XPostdates ? (41 weeks) Mo-Di isolated (32.0-34.6) Pregestational, vascular compromise (37.0- 39.0)  PPROM (34.0) Multiple Gestation Gestational, diet controlled (40.0)  Hx of IUFD  (39.0 weeks) Di-Di (38.0-38.6) Gestational, med controlled (39.0)  Polyhydramnios, mild/moderate; SDV 8-16 or AFI 25-35 (39.0) Mo-Di (36.0-37.6) Gestational, uncontrolled (38.0-39.0)  Oligohydramnios (36.0-37.6); MVP <2 cm  For indications not listed above, delivery recommendations from maternal-fetal medicine consultant occurred on: Date: with Dr.  for indication of:  Provider Signature: Ellouise Newer [facility] Scheduled by: Date:02/17/2023 1:37 PM   Call [phone] to finalize the induction date/time  [id number] (07/17)

## 2023-02-18 ENCOUNTER — Ambulatory Visit (INDEPENDENT_AMBULATORY_CARE_PROVIDER_SITE_OTHER): Payer: Medicaid Other

## 2023-02-18 DIAGNOSIS — Z3A40 40 weeks gestation of pregnancy: Secondary | ICD-10-CM | POA: Diagnosis not present

## 2023-02-18 DIAGNOSIS — O480 Post-term pregnancy: Secondary | ICD-10-CM

## 2023-02-18 DIAGNOSIS — O0993 Supervision of high risk pregnancy, unspecified, third trimester: Secondary | ICD-10-CM

## 2023-02-24 ENCOUNTER — Other Ambulatory Visit: Payer: Self-pay

## 2023-02-24 ENCOUNTER — Encounter: Payer: Medicaid Other | Admitting: Licensed Practical Nurse

## 2023-02-24 ENCOUNTER — Encounter: Payer: Self-pay | Admitting: Obstetrics and Gynecology

## 2023-02-24 ENCOUNTER — Inpatient Hospital Stay
Admission: RE | Admit: 2023-02-24 | Discharge: 2023-02-28 | DRG: 787 | Disposition: A | Discharge status: Home / Self Care | Payer: Medicaid Other | Attending: Family | Admitting: Family

## 2023-02-24 DIAGNOSIS — O43113 Circumvallate placenta, third trimester: Secondary | ICD-10-CM | POA: Diagnosis present

## 2023-02-24 DIAGNOSIS — Z3A40 40 weeks gestation of pregnancy: Secondary | ICD-10-CM

## 2023-02-24 DIAGNOSIS — O9903 Anemia complicating the puerperium: Secondary | ICD-10-CM | POA: Diagnosis not present

## 2023-02-24 DIAGNOSIS — O1205 Gestational edema, complicating the puerperium: Secondary | ICD-10-CM | POA: Diagnosis not present

## 2023-02-24 DIAGNOSIS — O0993 Supervision of high risk pregnancy, unspecified, third trimester: Secondary | ICD-10-CM

## 2023-02-24 DIAGNOSIS — O99324 Drug use complicating childbirth: Secondary | ICD-10-CM | POA: Diagnosis present

## 2023-02-24 DIAGNOSIS — F1921 Other psychoactive substance dependence, in remission: Secondary | ICD-10-CM | POA: Diagnosis not present

## 2023-02-24 DIAGNOSIS — O480 Post-term pregnancy: Principal | ICD-10-CM | POA: Diagnosis present

## 2023-02-24 DIAGNOSIS — O9902 Anemia complicating childbirth: Secondary | ICD-10-CM | POA: Diagnosis not present

## 2023-02-24 DIAGNOSIS — F1121 Opioid dependence, in remission: Secondary | ICD-10-CM | POA: Diagnosis present

## 2023-02-24 DIAGNOSIS — D62 Acute posthemorrhagic anemia: Secondary | ICD-10-CM | POA: Diagnosis not present

## 2023-02-24 DIAGNOSIS — O324XX0 Maternal care for high head at term, not applicable or unspecified: Secondary | ICD-10-CM | POA: Diagnosis not present

## 2023-02-24 DIAGNOSIS — O99325 Drug use complicating the puerperium: Secondary | ICD-10-CM | POA: Diagnosis not present

## 2023-02-24 DIAGNOSIS — Z3490 Encounter for supervision of normal pregnancy, unspecified, unspecified trimester: Secondary | ICD-10-CM | POA: Diagnosis present

## 2023-02-24 LAB — CBC
HCT: 37.3 % (ref 36.0–46.0)
Hemoglobin: 11.5 g/dL — ABNORMAL LOW (ref 12.0–15.0)
MCH: 26.7 pg (ref 26.0–34.0)
MCHC: 30.8 g/dL (ref 30.0–36.0)
MCV: 86.7 fL (ref 80.0–100.0)
Platelets: 156 10*3/uL (ref 150–400)
RBC: 4.3 MIL/uL (ref 3.87–5.11)
RDW: 18.6 % — ABNORMAL HIGH (ref 11.5–15.5)
WBC: 8.8 10*3/uL (ref 4.0–10.5)
nRBC: 0 % (ref 0.0–0.2)

## 2023-02-24 LAB — TYPE AND SCREEN
ABO/RH(D): O NEG
Antibody Screen: NEGATIVE

## 2023-02-24 LAB — RUPTURE OF MEMBRANE (ROM)PLUS: Rom Plus: NEGATIVE

## 2023-02-24 MED ORDER — MISOPROSTOL 50MCG HALF TABLET
ORAL_TABLET | ORAL | Status: AC
  Filled 2023-02-24: qty 1

## 2023-02-24 MED ORDER — FENTANYL CITRATE (PF) 100 MCG/2ML IJ SOLN
50.0000 ug | INTRAMUSCULAR | Status: DC | PRN
  Administered 2023-02-24 (×3): 100 ug via INTRAVENOUS
  Filled 2023-02-24 (×3): qty 2

## 2023-02-24 MED ORDER — OXYTOCIN-SODIUM CHLORIDE 30-0.9 UT/500ML-% IV SOLN
2.5000 [IU]/h | INTRAVENOUS | Status: DC
  Administered 2023-02-25: 20 [IU]/h via INTRAVENOUS
  Filled 2023-02-24: qty 500

## 2023-02-24 MED ORDER — METHADONE HCL 10 MG/ML PO CONC: 170.0000 mg | Freq: Every day | ORAL | Status: DC

## 2023-02-24 MED ORDER — OXYTOCIN BOLUS FROM INFUSION: 333.0000 mL | Freq: Once | INTRAVENOUS | Status: DC

## 2023-02-24 MED ORDER — OXYTOCIN 10 UNIT/ML IJ SOLN
INTRAMUSCULAR | Status: AC
  Filled 2023-02-24: qty 2

## 2023-02-24 MED ORDER — MISOPROSTOL 25 MCG QUARTER TABLET: 50.0000 ug | ORAL_TABLET | ORAL | Status: DC | PRN

## 2023-02-24 MED ORDER — ONDANSETRON HCL 4 MG/2ML IJ SOLN: 4.0000 mg | Freq: Four times a day (QID) | INTRAMUSCULAR | Status: DC | PRN

## 2023-02-24 MED ORDER — LACTATED RINGERS IV SOLN: INTRAVENOUS | Status: DC

## 2023-02-24 MED ORDER — TERBUTALINE SULFATE 1 MG/ML IJ SOLN
0.2500 mg | Freq: Once | INTRAMUSCULAR | Status: AC | PRN
  Administered 2023-02-24 (×3): 0.25 mg via SUBCUTANEOUS
  Filled 2023-02-24: qty 1

## 2023-02-24 MED ORDER — AMMONIA AROMATIC IN INHA
RESPIRATORY_TRACT | Status: AC
  Filled 2023-02-24: qty 10

## 2023-02-24 MED ORDER — LIDOCAINE HCL (PF) 1 % IJ SOLN: 30.0000 mL | INTRAMUSCULAR | Status: DC | PRN

## 2023-02-24 MED ORDER — LIDOCAINE HCL (PF) 1 % IJ SOLN
INTRAMUSCULAR | Status: AC
  Filled 2023-02-24: qty 30

## 2023-02-24 MED ORDER — HYDROXYZINE HCL 25 MG PO TABS: 50.0000 mg | ORAL_TABLET | Freq: Four times a day (QID) | ORAL | Status: DC | PRN

## 2023-02-24 MED ORDER — SOD CITRATE-CITRIC ACID 500-334 MG/5ML PO SOLN
30.0000 mL | ORAL | Status: DC | PRN
  Administered 2023-02-25: 30 mL via ORAL

## 2023-02-24 MED ORDER — MISOPROSTOL 200 MCG PO TABS
ORAL_TABLET | ORAL | Status: AC
  Filled 2023-02-24: qty 4

## 2023-02-24 MED ORDER — MISOPROSTOL 25 MCG QUARTER TABLET: 25.0000 ug | ORAL_TABLET | Freq: Once | ORAL | Status: DC

## 2023-02-24 MED ORDER — SERTRALINE HCL 100 MG PO TABS
100.0000 mg | ORAL_TABLET | Freq: Every day | ORAL | Status: DC
  Administered 2023-02-24 – 2023-02-25 (×2): 100 mg via ORAL
  Filled 2023-02-24 (×3): qty 1

## 2023-02-24 MED ORDER — MISOPROSTOL 25 MCG QUARTER TABLET
ORAL_TABLET | ORAL | Status: AC
  Filled 2023-02-24: qty 1

## 2023-02-24 MED ORDER — METHADONE HCL 10 MG/ML PO CONC
170.0000 mg | Freq: Once | ORAL | Status: DC
  Filled 2023-02-24: qty 20

## 2023-02-24 MED ORDER — LORAZEPAM 1 MG PO TABS
0.5000 mg | ORAL_TABLET | Freq: Four times a day (QID) | ORAL | Status: DC | PRN
  Administered 2023-02-24 – 2023-02-28 (×3): 0.5 mg via ORAL
  Filled 2023-02-24 (×3): qty 1

## 2023-02-24 MED ORDER — MISOPROSTOL 50MCG HALF TABLET
50.0000 ug | ORAL_TABLET | Freq: Once | ORAL | Status: AC
  Administered 2023-02-24: 50 ug via VAGINAL

## 2023-02-24 MED ORDER — LACTATED RINGERS IV SOLN
500.0000 mL | INTRAVENOUS | Status: DC | PRN
  Administered 2023-02-24: 500 mL via INTRAVENOUS

## 2023-02-24 MED ORDER — OXYTOCIN-SODIUM CHLORIDE 30-0.9 UT/500ML-% IV SOLN
1.0000 m[IU]/min | INTRAVENOUS | Status: DC
  Administered 2023-02-25: 2 m[IU]/min via INTRAVENOUS
  Filled 2023-02-24: qty 500

## 2023-02-24 NOTE — H&P (Addendum)
History and Physical   HPI  Lorraine Paul is a 32 y.o. G3P0020 at [redacted]w[redacted]d Estimated Date of Delivery: 02/18/23 who is being admitted for induction of labor for postdates. She reports some contractions. She states that a few days ago, she had a gush of clear/bloody fluid. She had another gush and then has been leaking small drops of fluid since then.  She reports significant swelling in all her extremities and back pain. She denies SOB. She endorses good fetal movement. She reports that she had a single hit from a cigarette around 0745 this AM and took her scheduled methadone at 0500. IV team needed to get IV access.   OB History  OB History  Gravida Para Term Preterm AB Living  3 0 0 0 2 0  SAB IAB Ectopic Multiple Live Births  2 0 0 0 0    # Outcome Date GA Lbr Len/2nd Weight Sex Delivery Anes PTL Lv  3 Current           2 SAB 03/10/22     SAB     1 SAB 05/11/16     SAB       PROBLEM LIST  Pregnancy complications or risks: Patient Active Problem List   Diagnosis Date Noted   Encounter for induction of labor 02/24/2023   Anti-D antibodies present during pregnancy 2023/01/24   Excessive weight gain during pregnancy in third trimester Jan 24, 2023   Labor and delivery, indication for care 01-24-23   Circumvallate placenta 09/23/2022   Opioid use disorder, mild, in sustained remission, on maintenance therapy 08/12/2022   Generalized anxiety disorder W/ panic attacks  08/12/2022   Panic attack 07/19/2022   Supervision of other normal pregnancy, antepartum 07/14/2022    Prenatal labs and studies: ABO, Rh: --/--/O NEG (04/18 1503) Antibody: NEG (04/18 1503) Rubella: 14.40 (09/13 0821) RPR: Non Reactive (01/19 1010)  HBsAg: Negative (09/13 0821)  HIV: Non Reactive (01/19 1010)  ZOX:[id number]/-- 24-Jan-2023 0847)   Past Medical History:  Diagnosis Date   Mental disorder    Ovarian cyst    Periodontal disease    has had 9 teeth removed   Skin infection 11/08/2014     Past  Surgical History:  Procedure Laterality Date   I & D EXTREMITY Left 04/08/2016   Procedure: IRRIGATION AND DEBRIDEMENT OF HAND;  Surgeon: Knute Neu, MD;  Location: [facility] OR;  Service: Plastics;  Laterality: Left;   MULTIPLE TOOTH EXTRACTIONS     9 d/t peridontal disease   OVARIAN CYST REMOVAL     TONSILLECTOMY     WISDOM TOOTH EXTRACTION     three; between 2021 and 2023     Medications    Current Discharge Medication List     CONTINUE these medications which have NOT CHANGED   Details  acetaminophen (TYLENOL) 325 MG tablet Take 1 tablet by mouth as needed.    diphenhydrAMINE (BENADRYL) 25 mg capsule Take 25 mg by mouth every 6 (six) hours as needed for sleep.    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    sertraline (ZOLOFT) 100 MG tablet Take 1 tablet (100 mg total) by mouth daily. Qty: 30 tablet, Refills: 6         Allergies  Other and Latex  Review of Systems  Constitutional: negative Ears, nose, mouth, throat, and face: negative Respiratory:  negative Cardiovascular: negative Gastrointestinal: negative Genitourinary:negative Musculoskeletal:negative Behavioral/Psych: anxious  Physical Exam  BP 136/83 (BP Location: Left Arm)   Pulse 83   Temp 98.6 F (37 C) (Oral)   Resp 16   LMP 04/08/2022 (Approximate)   General: Alert, cooperative, NAD Lungs:  CTAB Cardio: RRR without M/R/G Abd: Soft, gravid, NT Extremities: deep pitting edema to thighs, edema in both arms Presentation: cephalic  CERVIX: Dilation: Closed Effacement (%): 20 Cervical Position: Posterior Station: Ballotable Exam by:: Quitman Livings, CNM  See Prenatal records for more detailed PE.     FHR:  Baseline: 135 Variability: moderate, minimal Accelerations: present Decelerations: isolated variable Toco: irregular Category 2  Test  Results  Results for orders placed or performed during the hospital encounter of 02/24/23 (from the past 24 hour(s))  ROM Plus ([facility] only)     Status: None   Collection Time: 02/24/23  2:11 PM  Result Value Ref Range   Rom Plus NEGATIVE   CBC     Status: Abnormal   Collection Time: 02/24/23  3:03 PM  Result Value Ref Range   WBC 8.8 4.0 - 10.5 K/uL   RBC 4.30 3.87 - 5.11 MIL/uL   Hemoglobin 11.5 (L) 12.0 - 15.0 g/dL   HCT 40.9 81.1 - 91.4 %   MCV 86.7 80.0 - 100.0 fL   MCH 26.7 26.0 - 34.0 pg   MCHC 30.8 30.0 - 36.0 g/dL   RDW 78.2 (H) 95.6 - 21.3 %   Platelets 156 150 - 400 K/uL   nRBC 0.0 0.0 - 0.2 %  Type and screen     Status: None   Collection Time: 02/24/23  3:03 PM  Result Value Ref Range   ABO/RH(D) O NEG    Antibody Screen NEG    Sample Expiration      02/27/2023,2359 Performed at [facility], [address] [place], [place] [address]    Group B Strep positive, Rh negative  Assessment   G3P0020 at [redacted]w[redacted]d Estimated Date of Delivery: 02/18/23  Reassuring maternal/fetal status. Postdates  Patient Active Problem List   Diagnosis Date Noted   Encounter for induction of labor 02/24/2023   Anti-D antibodies present during pregnancy 2023-01-31   Excessive weight gain during pregnancy in third trimester 01/31/2023   Labor and delivery, indication for care Jan 31, 2023   Circumvallate placenta 09/23/2022   Opioid use disorder, mild, in sustained remission, on maintenance therapy 08/12/2022   Generalized anxiety disorder W/ panic attacks  08/12/2022   Panic attack 07/19/2022   Supervision of other normal pregnancy, antepartum 07/14/2022    Plan  1. Admit to L&D for cervical ripening and IOL 2. EFM: Continuous -- Category 2 3. Pharmacologic pain relief if desired.   4. Admission labs  5. Misoprostol for cervical ripening, Cook catheter when appropriate 6. Anticipate NSVD 7. Dr. Valentino Saxon notified of admission  [name], [facility] 02/24/2023 4:10 PM

## 2023-02-24 NOTE — Progress Notes (Signed)
Discussed plan of care with Dr. Valentino Saxon. Reviewed minimal cervical change and fetal monitoring strip. Per Dr. Valentino Saxon, will place Foley bulb when stable.  Glenetta Borg, CNM

## 2023-02-24 NOTE — Progress Notes (Signed)
LABOR NOTE   SUBJECTIVE:   Lorraine Paul is a 32 y.o.  G3P0020  at [redacted]w[redacted]d whose labor is being induced for postdates. She continues to have frequent, painful contractions. We have discussed Foley bulb placement, and Lorraine Paul is in agreement.  Analgesia: IV pain meds  OBJECTIVE:  BP 127/65   Pulse 68   Temp 98.2 F (36.8 C) (Oral)   Resp 17   Ht  (1.626 m)   Wt 98.9 kg   LMP 04/08/2022 (Approximate)   BMI 37.42 kg/m  No intake/output data recorded.  SVE:   Dilation: 1 Effacement (%): 50 Station: -3 Exam by:: Eino Whitner CNM CONTRACTIONS: irregular, every 1-2 minutes FHR: Fetal heart tracing reviewed. Baseline: 145 Variability: minimal/moderate Accelerations: no Decelerations:none Category 2   Labs: Lab Results  Component Value Date   WBC 8.8 02/24/2023   HGB 11.5 (L) 02/24/2023   HCT 37.3 02/24/2023   MCV 86.7 02/24/2023   PLT 156 02/24/2023    ASSESSMENT:     Induction of labor d/t postdates. Cervical ripening in progress.     Coping well     Membranes: intact  Principal Problem:   Encounter for induction of labor   PLAN: Foley bulb placed Plans epidural Anticipate NSVD  Lorraine Paul, CNM 02/24/2023 11:59 PM

## 2023-02-24 NOTE — Progress Notes (Signed)
LABOR NOTE   SUBJECTIVE:   Lorraine Paul is a 32 y.o.  G3P0020  at [redacted]w[redacted]d whose labor is being induced for postdates. She has received one dose of misoprostol and is feeling that she is not getting a break between contractions. A decel was noted at 2014 but unable to categorize.  Analgesia: IV pain meds  OBJECTIVE:  BP 127/65   Pulse 68   Temp 98.4 F (36.9 C) (Oral)   Resp 16   LMP 04/08/2022 (Approximate)  No intake/output data recorded.  SVE:   Dilation: Fingertip Effacement (%): 20 Station: -3 Exam by:: Zelene Barga, CNM CONTRACTIONS: regular, every 1 minutes FHR: Fetal heart tracing reviewed. Baseline: 150 Variability: minimal Accelerations: Decelerations: unable to determine Category 2  Labs: Lab Results  Component Value Date   WBC 8.8 02/24/2023   HGB 11.5 (L) 02/24/2023   HCT 37.3 02/24/2023   MCV 86.7 02/24/2023   PLT 156 02/24/2023    ASSESSMENT:      Cervical ripening      Tachysystole      Coping well     Membranes: intact  Principal Problem:   Encounter for induction of labor   PLAN: Terbutaline given Dr. Valentino Saxon notified Will place Foley bulb when contractions have spaced Anticipate NSVD  [name], CNM 02/24/2023 8:40 PM

## 2023-02-24 NOTE — Progress Notes (Signed)
LABOR NOTE   SUBJECTIVE:   Lorraine Paul is a 32 y.o.  G3P0020  at [redacted]w[redacted]d whose labor is being induced for postdates. She has received one dose of misoprostol and is feeling painful contractions. She has had some variable decelerations and periods of minimal variability. Cervical exam showed minimal change from prior exam.     Analgesia: IV pain meds  OBJECTIVE:  BP (!) 141/72 (BP Location: Left Arm)   Pulse 72   Temp 98.4 F (36.9 C) (Oral)   Resp 16   LMP 04/08/2022 (Approximate)  No intake/output data recorded.  SVE:   Dilation: Closed Effacement (%): 20 Station: -3 Exam by:: Phillips Goulette, CNM CONTRACTIONS: regular, every 1-2 minutes FHR: Fetal heart tracing reviewed. Baseline: 145 Variability: moderate, minimal Accelerations: no Decelerations:variable Category 2  Labs: Lab Results  Component Value Date   WBC 8.8 02/24/2023   HGB 11.5 (L) 02/24/2023   HCT 37.3 02/24/2023   MCV 86.7 02/24/2023   PLT 156 02/24/2023    ASSESSMENT:      Cervical ripening      Category 2 strip     Coping well     Membranes: intact      Principal Problem:   Encounter for induction of labor   PLAN: Dr. Valentino Saxon called to L&D to review strip. Discussed findings.  Will place Foley bulb when appropriate Anticipate NSVD  [name], CNM 02/24/2023 7:40 PM

## 2023-02-24 NOTE — Progress Notes (Signed)
Progress Note  Reviewed EFM strip with Dr. Valentino Saxon. Dr. Valentino Saxon to the bedside to assess edema. Will give fluid bolus. Discussed possibility of cesarean birth with Danni if there are concerns for FHR remote from delivery. She verbalized understanding.  Glenetta Borg, CNM

## 2023-02-24 NOTE — Progress Notes (Signed)
To bedside for prolonged deceleration. Fluid bolus infusing. Terbutaline given. Position changed. Dr. Valentino Saxon notified of decel and presence requested at the bedside. Abagail placed in H&K position. FHR recovered to 150s.  Glenetta Borg, CNM 02/24/23 10:30 PM

## 2023-02-25 ENCOUNTER — Inpatient Hospital Stay: Payer: Medicaid Other | Admitting: Certified Registered"

## 2023-02-25 ENCOUNTER — Inpatient Hospital Stay: Payer: Medicaid Other | Admitting: Anesthesiology

## 2023-02-25 ENCOUNTER — Encounter: Payer: Self-pay | Admitting: Obstetrics and Gynecology

## 2023-02-25 ENCOUNTER — Encounter
Admission: RE | Disposition: A | Discharge status: Home / Self Care | Payer: Self-pay | Source: Home / Self Care | Attending: Family

## 2023-02-25 DIAGNOSIS — O480 Post-term pregnancy: Secondary | ICD-10-CM | POA: Diagnosis not present

## 2023-02-25 DIAGNOSIS — F1921 Other psychoactive substance dependence, in remission: Secondary | ICD-10-CM

## 2023-02-25 DIAGNOSIS — Z3A41 41 weeks gestation of pregnancy: Secondary | ICD-10-CM | POA: Diagnosis not present

## 2023-02-25 DIAGNOSIS — D62 Acute posthemorrhagic anemia: Secondary | ICD-10-CM

## 2023-02-25 DIAGNOSIS — O1205 Gestational edema, complicating the puerperium: Secondary | ICD-10-CM

## 2023-02-25 DIAGNOSIS — Z3A40 40 weeks gestation of pregnancy: Secondary | ICD-10-CM

## 2023-02-25 DIAGNOSIS — O9903 Anemia complicating the puerperium: Secondary | ICD-10-CM

## 2023-02-25 DIAGNOSIS — O99325 Drug use complicating the puerperium: Secondary | ICD-10-CM

## 2023-02-25 LAB — RPR: RPR Ser Ql: NONREACTIVE

## 2023-02-25 SURGERY — Surgical Case
Anesthesia: Epidural

## 2023-02-25 MED ORDER — EPHEDRINE 5 MG/ML INJ: 10.0000 mg | INTRAVENOUS | Status: DC | PRN

## 2023-02-25 MED ORDER — METHADONE HCL 10 MG/ML PO CONC
190.0000 mg | Freq: Every day | ORAL | Status: DC
  Administered 2023-02-25 – 2023-02-28 (×4): 190 mg via ORAL
  Filled 2023-02-25 (×4): qty 20

## 2023-02-25 MED ORDER — NALOXONE HCL 0.4 MG/ML IJ SOLN: 0.4000 mg | INTRAMUSCULAR | Status: DC | PRN

## 2023-02-25 MED ORDER — MORPHINE SULFATE (PF) 0.5 MG/ML IJ SOLN
INTRAMUSCULAR | Status: AC
  Filled 2023-02-25: qty 10

## 2023-02-25 MED ORDER — OXYCODONE HCL 5 MG PO TABS
5.0000 mg | ORAL_TABLET | Freq: Four times a day (QID) | ORAL | Status: DC | PRN
  Administered 2023-02-26 (×3): 5 mg via ORAL
  Filled 2023-02-25 (×4): qty 1

## 2023-02-25 MED ORDER — SCOPOLAMINE 1 MG/3DAYS TD PT72
1.0000 | MEDICATED_PATCH | Freq: Once | TRANSDERMAL | Status: DC
  Administered 2023-02-26: 1.5 mg via TRANSDERMAL
  Filled 2023-02-25: qty 1

## 2023-02-25 MED ORDER — LIDOCAINE 5 % EX PTCH
MEDICATED_PATCH | CUTANEOUS | Status: DC | PRN
  Administered 2023-02-25: 1 via TRANSDERMAL

## 2023-02-25 MED ORDER — CEFAZOLIN SODIUM-DEXTROSE 2-4 GM/100ML-% IV SOLN
2.0000 g | INTRAVENOUS | Status: AC
  Administered 2023-02-25: 2 g via INTRAVENOUS

## 2023-02-25 MED ORDER — DIPHENHYDRAMINE HCL 25 MG PO CAPS: 25.0000 mg | ORAL_CAPSULE | ORAL | Status: DC | PRN

## 2023-02-25 MED ORDER — CEFAZOLIN SODIUM-DEXTROSE 2-4 GM/100ML-% IV SOLN
INTRAVENOUS | Status: AC
  Filled 2023-02-25: qty 100

## 2023-02-25 MED ORDER — FENTANYL CITRATE (PF) 100 MCG/2ML IJ SOLN
INTRAMUSCULAR | Status: AC
  Filled 2023-02-25: qty 2

## 2023-02-25 MED ORDER — LIDOCAINE HCL (PF) 2 % IJ SOLN
INTRAMUSCULAR | Status: DC | PRN
  Administered 2023-02-25 (×3): 5 mL via INTRADERMAL

## 2023-02-25 MED ORDER — LIDOCAINE 5 % EX PTCH
MEDICATED_PATCH | CUTANEOUS | Status: AC
  Filled 2023-02-25: qty 1

## 2023-02-25 MED ORDER — DIPHENHYDRAMINE HCL 50 MG/ML IJ SOLN: 12.5000 mg | INTRAMUSCULAR | Status: DC | PRN

## 2023-02-25 MED ORDER — PROPOFOL 10 MG/ML IV BOLUS
INTRAVENOUS | Status: DC | PRN
  Administered 2023-02-25 (×2): 30 mg via INTRAVENOUS
  Administered 2023-02-25: 50 mg via INTRAVENOUS

## 2023-02-25 MED ORDER — BUPIVACAINE HCL (PF) 0.25 % IJ SOLN
INTRAMUSCULAR | Status: DC | PRN
  Administered 2023-02-25 (×2): 4 mL via EPIDURAL
  Administered 2023-02-25: 6 mL via EPIDURAL
  Administered 2023-02-25: 4 mL via EPIDURAL

## 2023-02-25 MED ORDER — FENTANYL CITRATE (PF) 100 MCG/2ML IJ SOLN
INTRAMUSCULAR | Status: DC | PRN
  Administered 2023-02-25: 100 ug via INTRAVENOUS

## 2023-02-25 MED ORDER — ACETAMINOPHEN 10 MG/ML IV SOLN
INTRAVENOUS | Status: AC
  Filled 2023-02-25: qty 100

## 2023-02-25 MED ORDER — ONDANSETRON HCL 4 MG/2ML IJ SOLN
INTRAMUSCULAR | Status: DC | PRN
  Administered 2023-02-25: 4 mg via INTRAVENOUS

## 2023-02-25 MED ORDER — KETOROLAC TROMETHAMINE 30 MG/ML IJ SOLN
INTRAMUSCULAR | Status: DC | PRN
  Administered 2023-02-25: 30 mg via INTRAVENOUS

## 2023-02-25 MED ORDER — CARBOPROST TROMETHAMINE 250 MCG/ML IM SOLN
INTRAMUSCULAR | Status: AC
  Filled 2023-02-25: qty 1

## 2023-02-25 MED ORDER — FENTANYL-BUPIVACAINE-NACL 0.5-0.125-0.9 MG/250ML-% EP SOLN
EPIDURAL | Status: AC
  Administered 2023-02-25: 12 mL/h via EPIDURAL
  Filled 2023-02-25: qty 250

## 2023-02-25 MED ORDER — PHENYLEPHRINE 80 MCG/ML (10ML) SYRINGE FOR IV PUSH (FOR BLOOD PRESSURE SUPPORT): 80.0000 ug | PREFILLED_SYRINGE | INTRAVENOUS | Status: DC | PRN

## 2023-02-25 MED ORDER — POVIDONE-IODINE 10 % EX SWAB
2.0000 | Freq: Once | CUTANEOUS | Status: DC
  Administered 2023-02-25: 2 via TOPICAL

## 2023-02-25 MED ORDER — MEPERIDINE HCL 25 MG/ML IJ SOLN: 6.2500 mg | INTRAMUSCULAR | Status: DC | PRN

## 2023-02-25 MED ORDER — PHENYLEPHRINE HCL-NACL 20-0.9 MG/250ML-% IV SOLN
INTRAVENOUS | Status: AC
  Filled 2023-02-25: qty 250

## 2023-02-25 MED ORDER — SODIUM CHLORIDE 0.9 % IV SOLN: 500.0000 mg | INTRAVENOUS | Status: DC

## 2023-02-25 MED ORDER — KETOROLAC TROMETHAMINE 30 MG/ML IJ SOLN: 30.0000 mg | Freq: Four times a day (QID) | INTRAMUSCULAR | Status: AC | PRN

## 2023-02-25 MED ORDER — 0.9 % SODIUM CHLORIDE (POUR BTL) OPTIME
TOPICAL | Status: DC | PRN
  Administered 2023-02-25: 150 mL

## 2023-02-25 MED ORDER — FENTANYL-BUPIVACAINE-NACL 0.5-0.125-0.9 MG/250ML-% EP SOLN
12.0000 mL/h | EPIDURAL | Status: DC | PRN
  Administered 2023-02-25: 12 mL/h via EPIDURAL
  Filled 2023-02-25: qty 250

## 2023-02-25 MED ORDER — SODIUM CHLORIDE 0.9 % IV SOLN
INTRAVENOUS | Status: DC | PRN
  Administered 2023-02-25 (×2): 5 mL via EPIDURAL

## 2023-02-25 MED ORDER — SODIUM CHLORIDE 0.9 % IV SOLN
INTRAVENOUS | Status: AC
  Filled 2023-02-25: qty 5

## 2023-02-25 MED ORDER — FLEET ENEMA 7-19 GM/118ML RE ENEM
1.0000 | ENEMA | Freq: Once | RECTAL | Status: AC
  Administered 2023-02-25: 1 via RECTAL

## 2023-02-25 MED ORDER — LIDOCAINE-EPINEPHRINE (PF) 1.5 %-1:200000 IJ SOLN
INTRAMUSCULAR | Status: DC | PRN
  Administered 2023-02-25 (×2): 3 mL via EPIDURAL

## 2023-02-25 MED ORDER — DEXAMETHASONE SODIUM PHOSPHATE 10 MG/ML IJ SOLN
INTRAMUSCULAR | Status: AC
  Filled 2023-02-25: qty 1

## 2023-02-25 MED ORDER — ACETAMINOPHEN 10 MG/ML IV SOLN
INTRAVENOUS | Status: DC | PRN
  Administered 2023-02-25: 1000 mg via INTRAVENOUS

## 2023-02-25 MED ORDER — DEXAMETHASONE SODIUM PHOSPHATE 10 MG/ML IJ SOLN
INTRAMUSCULAR | Status: DC | PRN
  Administered 2023-02-25: 10 mg via INTRAVENOUS

## 2023-02-25 MED ORDER — MORPHINE SULFATE (PF) 0.5 MG/ML IJ SOLN
INTRAMUSCULAR | Status: DC | PRN
  Administered 2023-02-25: 3 mg via EPIDURAL

## 2023-02-25 MED ORDER — LIDOCAINE HCL (PF) 1 % IJ SOLN
INTRAMUSCULAR | Status: DC | PRN
  Administered 2023-02-25 (×2): 1 mL via SUBCUTANEOUS

## 2023-02-25 MED ORDER — SOD CITRATE-CITRIC ACID 500-334 MG/5ML PO SOLN
ORAL | Status: AC
  Filled 2023-02-25: qty 15

## 2023-02-25 MED ORDER — NALOXONE HCL 4 MG/10ML IJ SOLN: 1.0000 ug/kg/h | INTRAVENOUS | Status: DC | PRN

## 2023-02-25 MED ORDER — MISOPROSTOL 200 MCG PO TABS
ORAL_TABLET | ORAL | Status: AC
  Filled 2023-02-25: qty 5

## 2023-02-25 MED ORDER — METHYLERGONOVINE MALEATE 0.2 MG/ML IJ SOLN
INTRAMUSCULAR | Status: AC
  Filled 2023-02-25: qty 1

## 2023-02-25 MED ORDER — FENTANYL INJECTION ORDERABLE
Status: DC | PRN
  Administered 2023-02-25 (×2): 50 ug via EPIDURAL

## 2023-02-25 MED ORDER — DEXMEDETOMIDINE HCL IN NACL 80 MCG/20ML IV SOLN
INTRAVENOUS | Status: DC | PRN
  Administered 2023-02-25: 8 ug via BUCCAL
  Administered 2023-02-25: 12 ug via BUCCAL
  Administered 2023-02-25: 8 ug via BUCCAL

## 2023-02-25 MED ORDER — LACTATED RINGERS IV SOLN: 500.0000 mL | Freq: Once | INTRAVENOUS | Status: DC

## 2023-02-25 MED ORDER — ONDANSETRON HCL 4 MG/2ML IJ SOLN
INTRAMUSCULAR | Status: AC
  Filled 2023-02-25: qty 2

## 2023-02-25 MED ORDER — ONDANSETRON HCL 4 MG/2ML IJ SOLN: 4.0000 mg | Freq: Three times a day (TID) | INTRAMUSCULAR | Status: DC | PRN

## 2023-02-25 MED ORDER — SODIUM CHLORIDE 0.9% FLUSH: 3.0000 mL | INTRAVENOUS | Status: DC | PRN

## 2023-02-25 SURGICAL SUPPLY — 32 items
ADH LQ OCL WTPRF AMP STRL LF (MISCELLANEOUS) ×1
ADHESIVE MASTISOL STRL (MISCELLANEOUS) ×2 IMPLANT
APL PRP STRL LF DISP 70% ISPRP (MISCELLANEOUS) ×2
BAG COUNTER SPONGE SURGICOUNT (BAG) ×2 IMPLANT
BAG SPNG CNTER NS LX DISP (BAG) ×1
BNDG TENSOPLAST 6X5 (GAUZE/BANDAGES/DRESSINGS) IMPLANT
CHLORAPREP W/TINT 26 (MISCELLANEOUS) ×4 IMPLANT
CLOSURE STERI STRIP 1/2 X4 (GAUZE/BANDAGES/DRESSINGS) IMPLANT
DRSG CURAFIL 4X4 STRL (GAUZE/BANDAGES/DRESSINGS) IMPLANT
DRSG TELFA 3X8 NADH STRL (GAUZE/BANDAGES/DRESSINGS) ×2 IMPLANT
GAUZE CURAFIL 4X4 (GAUZE/BANDAGES/DRESSINGS) IMPLANT
GAUZE SPONGE 4X4 12PLY STRL (GAUZE/BANDAGES/DRESSINGS) ×2 IMPLANT
GLOVE PI ORTHO PRO STRL 7.5 (GLOVE) ×2 IMPLANT
GOWN STRL REUS W/ TWL LRG LVL3 (GOWN DISPOSABLE) ×4 IMPLANT
GOWN STRL REUS W/TWL LRG LVL3 (GOWN DISPOSABLE) ×2
KIT TURNOVER KIT A (KITS) ×2 IMPLANT
MANIFOLD NEPTUNE II (INSTRUMENTS) ×2 IMPLANT
MAT PREVALON FULL STRYKER (MISCELLANEOUS) ×2 IMPLANT
NS IRRIG 1000ML POUR BTL (IV SOLUTION) ×2 IMPLANT
PACK C SECTION AR (MISCELLANEOUS) ×2 IMPLANT
PAD OB MATERNITY 4.3X12.25 (PERSONAL CARE ITEMS) ×2 IMPLANT
PAD PREP 24X41 OB/GYN DISP (PERSONAL CARE ITEMS) ×2 IMPLANT
RETRACTOR WND ALEXIS-O 25 LRG (MISCELLANEOUS) ×2 IMPLANT
RTRCTR WOUND ALEXIS O 25CM LRG (MISCELLANEOUS) ×1
SCRUB CHG 4% DYNA-HEX 4OZ (MISCELLANEOUS) ×2 IMPLANT
SPONGE T-LAP 18X18 ~~LOC~~+RFID (SPONGE) ×2 IMPLANT
SUT VIC AB 0 CTX 36 (SUTURE) ×2
SUT VIC AB 0 CTX36XBRD ANBCTRL (SUTURE) ×4 IMPLANT
SUT VIC AB 1 CT1 36 (SUTURE) ×4 IMPLANT
SUT VICRYL+ 3-0 36IN CT-1 (SUTURE) ×4 IMPLANT
TRAP FLUID SMOKE EVACUATOR (MISCELLANEOUS) ×2 IMPLANT
WATER STERILE IRR 500ML POUR (IV SOLUTION) ×2 IMPLANT

## 2023-02-25 NOTE — Anesthesia Procedure Notes (Signed)
Epidural Patient location during procedure: OB Start time: 02/25/2023 12:31 AM End time: 02/25/2023 12:41 AM  Staffing Anesthesiologist: Gwenith Tschida, Cleda Mccreedy, MD Performed: anesthesiologist   Preanesthetic Checklist Completed: patient identified, IV checked, site marked, risks and benefits discussed, surgical consent, monitors and equipment checked, pre-op evaluation and timeout performed  Epidural Patient position: sitting Prep: ChloraPrep Patient monitoring: heart rate, continuous pulse ox and blood pressure Approach: midline Location: L3-L4 Injection technique: LOR saline  Needle:  Needle type: Tuohy  Needle gauge: 17 G Needle length: 9 cm and 9 Needle insertion depth: 7 cm Catheter type: closed end flexible Catheter size: 19 Gauge Catheter at skin depth: 12 cm Test dose: negative and 1.5% lidocaine with Epi 1:200 K  Assessment Sensory level: T10 Events: blood not aspirated, no cerebrospinal fluid, injection not painful, no injection resistance, no paresthesia and negative IV test  Additional Notes 2 attempts Pt. Evaluated and documentation done after procedure finished. Patient identified. Risks/Benefits/Options discussed with patient including but not limited to bleeding, infection, nerve damage, paralysis, failed block, incomplete pain control, headache, blood pressure changes, nausea, vomiting, reactions to medication both or allergic, itching and postpartum back pain. Confirmed with bedside nurse the patient's most recent platelet count. Confirmed with patient that they are not currently taking any anticoagulation, have any bleeding history or any family history of bleeding disorders. Patient expressed understanding and wished to proceed. All questions were answered. Sterile technique was used throughout the entire procedure. Please see nursing notes for vital signs. Test dose was given through epidural catheter and negative prior to continuing to dose epidural or start  infusion. Warning signs of high block given to the patient including shortness of breath, tingling/numbness in hands, complete motor block, or any concerning symptoms with instructions to call for help. Patient was given instructions on fall risk and not to get out of bed. All questions and concerns addressed with instructions to call with any issues or inadequate analgesia.    Patient tolerated the insertion well without immediate complications.Reason for block:procedure for pain

## 2023-02-25 NOTE — Progress Notes (Signed)
  Labor Progress Note   32 y.o. G3P0020 @ [redacted]w[redacted]d   Subjective:  Feeling pressure, would like to be checked.  Objective:  BP 132/65 (BP Location: Right Arm)   Pulse 60   Temp 97.9 F (36.6 C) (Oral)   Resp 18   Ht  (1.626 m)   Wt 98.9 kg   LMP 04/08/2022 (Approximate)   SpO2 99%   BMI 37.42 kg/m  Abd: gravid SVE: 8cm/100%/-1, vertex  EFM: 145, min variability, no accels, some variable decels Toco: Ucs q2-45min, Pit at 18mu/min, MVUs 135   Assessment  G3P0020 @ [redacted]w[redacted]d Transition Arrest of dilation VSS FHR Cat II   Plan:   1. Consulted with Dr. Logan Bores regarding plan of care. Patient had not made any cervical change in past few hours and has made very slow change throughout the day. Baby has consistently been in Cat II strip. Discussed with patient possibility of continuing IOL with hopes of vaginal delivery by continuing to increase pitocin up to 30. Also discussed option of c/s. I suspect the baby likely has very little reserves left and if we got to the point of pushing that he might not handle that well. The FHR Cat has also been Cat II for almost the entire induction for about 32 hours now. Patient states she would prefer the c/s at this point.  2. Dr. Logan Bores notified of decision to proceed with c/s, he is on his way in to the hospital. 3. Anticipate c/s soon  All discussed with patient  Raeford Razor CNM, FNP [facility] 02/25/2023  8:47 PM

## 2023-02-25 NOTE — Progress Notes (Signed)
  Labor Progress Note   32 y.o. [id number] @ [redacted]w[redacted]d, IOL for postdates   Subjective:  Patient is comfortable with epidural, does feel pressure but not feeling contractions.  Objective:  BP 139/64   Pulse 65   Temp 97.8 F (36.6 C) (Oral)   Resp 16   Ht  (1.626 m)   Wt 98.9 kg   LMP 04/08/2022 (Approximate)   SpO2 100%   BMI 37.42 kg/m  Abd: gravid SVE: 8cm/100%/-1, vertex  Last SVE performed by RN was stated to be 9.5cm. I do feel a very thin cervix that likely was missed.  EFM: 150, min variability, no accels, was having recurrent late decels which have resolved, some variable decels Toco: Ucs q3-61min, MVUs at 140, Pit at 84mu/min   Assessment  G3P0020 @ [redacted]w[redacted]d transition VSS FHR Cat II, MD aware   Plan:   1. Will increase pitocin up to 35mu/min and monitor baby and MVUs to hopefully achieve an adequate contraction pattern 2. Dr. Logan Bores updated of patient status and in agreement with plan 3. Plan SVE in 3 hours or sooner PRN.  All discussed with patient  Raeford Razor CNM, FNP [facility] 02/25/2023  6:29 PM

## 2023-02-25 NOTE — Progress Notes (Signed)
LABOR NOTE   SUBJECTIVE:   Lorraine Paul is a 32 y.o.  G3P0020  at [redacted]w[redacted]d whose labor is being induced for postdates. She has shown minimal cervical change since her last exam. She has a moderate amount of bloody show. She is receiving 2 mU/min of Pitocin. She has been sleeping with her epidural in place.   Analgesia: Epidural  OBJECTIVE:  BP (!) 125/55 (BP Location: Left Arm)   Pulse 67   Temp 98.1 F (36.7 C) (Oral)   Resp 16   Ht  (1.626 m)   Wt 98.9 kg   LMP 04/08/2022 (Approximate)   SpO2 97%   BMI 37.42 kg/m  No intake/output data recorded.  SVE:   Dilation: 3 Effacement (%): 70 Station: -3 Exam by:: Anah Billard CNM CONTRACTIONS: irregular, every 1-3 minutes FHR: Fetal heart tracing reviewed. Baseline: 130 Variability: moderate, minimal Accelerations: present Decelerations:variable Category 2  Labs: Lab Results  Component Value Date   WBC 8.8 02/24/2023   HGB 11.5 (L) 02/24/2023   HCT 37.3 02/24/2023   MCV 86.7 02/24/2023   PLT 156 02/24/2023    ASSESSMENT: 1)  Cervical ripening, Cat 2 tracing     Coping well     Membranes: intact  Principal Problem:   Encounter for induction of labor   PLAN: Continue present management Anticipate NSVD   [name], CNM 02/25/2023 6:34 AM

## 2023-02-25 NOTE — Anesthesia Preprocedure Evaluation (Signed)
Anesthesia Evaluation  Patient identified by MRN, date of birth, ID band Patient awake    Reviewed: Allergy & Precautions, NPO status , Patient's Chart, lab work & pertinent test results  History of Anesthesia Complications Negative for: history of anesthetic complications  Airway Mallampati: III  TM Distance: >3 FB Neck ROM: full    Dental  (+) Chipped, Poor Dentition, Missing   Pulmonary Current Smoker   Pulmonary exam normal        Cardiovascular Exercise Tolerance: Good (-) hypertensionnegative cardio ROS Normal cardiovascular exam     Neuro/Psych    GI/Hepatic negative GI ROS,,,  Endo/Other    Renal/GU   negative genitourinary   Musculoskeletal   Abdominal   Peds  Hematology negative hematology ROS (+)   Anesthesia Other Findings Past Medical History: No date: Mental disorder No date: Ovarian cyst No date: Periodontal disease     Comment:  has had 9 teeth removed 11/08/2014: Skin infection  Past Surgical History: 04/08/2016: I & D EXTREMITY; Left     Comment:  Procedure: IRRIGATION AND DEBRIDEMENT OF HAND;  Surgeon:              Knute Neu, MD;  Location: [facility] OR;  Service: Plastics;               Laterality: Left; No date: MULTIPLE TOOTH EXTRACTIONS     Comment:  9 d/t peridontal disease No date: OVARIAN CYST REMOVAL No date: TONSILLECTOMY No date: WISDOM TOOTH EXTRACTION     Comment:  three; between 2021 and 2023  BMI    Body Mass Index: 37.42 kg/m      Reproductive/Obstetrics (+) Pregnancy                             Anesthesia Physical Anesthesia Plan  ASA: 2  Anesthesia Plan: Epidural   Post-op Pain Management:    Induction:   PONV Risk Score and Plan:   Airway Management Planned: Natural Airway  Additional Equipment:   Intra-op Plan:   Post-operative Plan:   Informed Consent: I have reviewed the patients History and Physical, chart, labs and  discussed the procedure including the risks, benefits and alternatives for the proposed anesthesia with the patient or authorized representative who has indicated his/her understanding and acceptance.     Dental Advisory Given  Plan Discussed with: Anesthesiologist  Anesthesia Plan Comments: (Patient reports no bleeding problems and no anticoagulant use.   Patient consented for risks of anesthesia including but not limited to:  - adverse reactions to medications - risk of bleeding, infection and or nerve damage from epidural that could lead to paralysis - risk of headache or failed epidural - nerve damage due to positioning - that if epidural is used for C-section that there is a chance of epidural failure requiring spinal placement or conversion to GA - Damage to heart, brain, lungs, other parts of body or loss of life  Patient voiced understanding.)       Anesthesia Quick Evaluation

## 2023-02-25 NOTE — Anesthesia Procedure Notes (Signed)
Epidural Patient location during procedure: OB Start time: 02/25/2023 9:22 AM  Staffing Resident/CRNA: Jeanine Luz, CRNA Performed: resident/CRNA   Preanesthetic Checklist Completed: patient identified, IV checked, site marked, risks and benefits discussed, surgical consent, monitors and equipment checked, pre-op evaluation and timeout performed  Epidural Patient position: sitting Prep: ChloraPrep Patient monitoring: heart rate, blood pressure and continuous pulse ox Approach: midline Injection technique: LOR saline  Needle:  Needle type: Tuohy  Needle gauge: 17 G Needle length: 9 cm Needle insertion depth: 7 cm Catheter type: closed end flexible Catheter size: 19 Gauge Catheter at skin depth: 12 cm Test dose: negative and 1.5% lidocaine with Epi 1:200 K  Assessment Events: blood not aspirated, no cerebrospinal fluid, injection not painful, no injection resistance, no paresthesia and negative IV test  Additional Notes Consent obtained.  Pt tolerated well.  Negative test dose.  Reason for block:at surgeon's request

## 2023-02-25 NOTE — Progress Notes (Signed)
LABOR NOTE   SUBJECTIVE:   Lorraine Paul is a 32 y.o.  G3P0020  at [redacted]w[redacted]d whose labor is being induced for postdates. She is comfortable with her epidural. Her Foley balloon is still in place.  Analgesia: Epidural  OBJECTIVE:  BP 127/65   Pulse 68   Temp 98.2 F (36.8 C) (Oral)   Resp 17   Ht  (1.626 m)   Wt 98.9 kg   LMP 04/08/2022 (Approximate)   BMI 37.42 kg/m  No intake/output data recorded.  SVE:   Dilation: 1 Effacement (%): 50 Station: -3 Exam by:: Feliciana Narayan CNM CONTRACTIONS: regular, every 1-2 minutes FHR: Fetal heart tracing reviewed. Baseline: 125 Variability: currently moderate, periods of minimal Accelerations: no Decelerations:prolonged decel around 0200 Category 2  Labs: Lab Results  Component Value Date   WBC 8.8 02/24/2023   HGB 11.5 (L) 02/24/2023   HCT 37.3 02/24/2023   MCV 86.7 02/24/2023   PLT 156 02/24/2023    ASSESSMENT:      Cervical ripening     Coping well     Membranes: intact  Principal Problem:   Encounter for induction of labor   PLAN: Continue present management Anticipate NSVD  [name], CNM 02/25/2023 2:44 AM

## 2023-02-25 NOTE — Op Note (Addendum)
     OP NOTE  Date: 02/25/2023   11:00 PM Name Lorraine Paul MR# [id number]  Preoperative Diagnosis: 1. Intrauterine pregnancy at [redacted]w[redacted]d Principal Problem:   Encounter for induction of labor  2.  Failure to dilate  Postoperative Diagnosis: 1. Intrauterine pregnancy at [redacted]w[redacted]d, delivered 2. Viable infant 3. Remainder same as pre-op   Procedure: 1. Primary Low-Transverse Cesarean Section  Surgeon: Elonda Husky, MD  Assistant:  Bettey Costa No other capable assistant was available for this surgery which requires an experienced, high level assistant.   Anesthesia: Epidural    EBL: 700 ml     Findings: 1) female infant, Apgar scores of    at 1 minute and    at 5 minutes and a birthweight of   ounces.    2) Normal uterus, tubes and ovaries.    Procedure:  The patient was prepped and draped in the supine position and placed under spinal anesthesia.  A transverse incision was made across the abdomen in a Pfannenstiel manner. If indicated the old scar was systematically removed with sharp dissection.  We carried the dissection down to the level of the fascia.  The fascia was incised in a curvilinear manner.  The fascia was then elevated from the rectus muscles with blunt and sharp dissection.  The rectus muscles were separated laterally exposing the peritoneum.  The peritoneum was carefully entered with care being taken to avoid bowel and bladder.  A self-retaining retractor was placed.  The visceral peritoneum was incised in a curvilinear fashion across the lower uterine segment creating a bladder flap. A transverse incision was made across the lower uterine segment and extended laterally and superiorly using the bandage scissors.  Artificial rupture membranes was performed and Clear fluid was noted.  The infant was delivered from the cephalic position.  A nuchal cord was not present. After an appropriate time interval, the cord was doubly clamped and cut. Cord blood was obtained if  required.  The infant was handed to the pediatric personnel  who then placed the infant under heat lamps where it was cleaned dried and suctioned as needed. The placenta was delivered. The hysterotomy incision was then identified on ring forceps.  The uterine cavity was cleaned with a moist lap sponge.  The hysterotomy incision was closed with a running interlocking suture of Vicryl.  Hemostasis was excellent.  Pitocin was run in the IV and the uterus was found to be firm. The posterior cul-de-sac and gutters were cleaned and inspected.  Hemostasis was noted.  The fascia was then closed with a running suture of #1 Vicryl.  Hemostasis of the subcutaneous tissues was obtained using the Bovie.  The subcutaneous tissues were closed with a running suture of 000 Vicryl.  A subcuticular suture was placed.  Steri-strips were applied in the usual manner.  A Lidoderm patch was applied.  A pressure dressing was placed.  The patient went to the recovery room in stable condition. Cowen CNM provided exposure, dissection, suctioning, retraction, and general support and assistance during the procedure.   Elonda Husky, M.D. 02/25/2023 11:00 PM

## 2023-02-25 NOTE — Progress Notes (Signed)
LABOR NOTE   SUBJECTIVE:   Lorraine Paul is a 32 y.o.  G3P0020  at [redacted]w[redacted]d whose labor is being induced for postdates. She has an epidural and is more comfortable. Her Foley bulb was expelled. She has made cervical change. Membranes swept, bloody show noted. Will begin low-dose Pitocin.  Analgesia: Epidural  OBJECTIVE:  BP 134/70   Pulse 70   Temp 98.2 F (36.8 C) (Oral)   Resp 17   Ht  (1.626 m)   Wt 98.9 kg   LMP 04/08/2022 (Approximate)   SpO2 97%   BMI 37.42 kg/m  No intake/output data recorded.  SVE:   Dilation: 3 Effacement (%): 50 Station: -3 Exam by:: Montray Kliebert CNM CONTRACTIONS: irritability, difficulty tracing contractions FHR: Fetal heart tracing reviewed. Baseline: 120 Variability: moderate, minimal Accelerations: none Decelerations:none Category 2  Labs: Lab Results  Component Value Date   WBC 8.8 02/24/2023   HGB 11.5 (L) 02/24/2023   HCT 37.3 02/24/2023   MCV 86.7 02/24/2023   PLT 156 02/24/2023    ASSESSMENT: 1)  Induction of labor, progressing     Coping: well     Membranes: intact     Principal Problem:   Encounter for induction of labor   PLAN: Start low-dose Pitocin Consider AROM when appropriate Anticipate NSVD  Lorraine Paul, CNM 02/25/2023 4:19 AM

## 2023-02-25 NOTE — Transfer of Care (Signed)
Immediate Anesthesia Transfer of Care Note  Patient: Lorraine Paul  Procedure(s) Performed: CESAREAN SECTION  Patient Location: Mother/Baby  Anesthesia Type:Epidural  Level of Consciousness: awake, alert , and oriented  Airway & Oxygen Therapy: Patient Spontanous Breathing  Post-op Assessment: Report given to RN and Post -op Vital signs reviewed and stable  Post vital signs: Reviewed and stable  Last Vitals:  Vitals Value Taken Time  BP 144/73 02/25/23 2314  Temp 36.4 C 02/25/23 2314  Pulse 72 02/25/23 2314  Resp 20 02/25/23 2314  SpO2 98 % 02/25/23 2314    Last Pain:  Vitals:   02/25/23 2314  TempSrc: Tympanic  PainSc: 2       Patients Stated Pain Goal: 0 (02/25/23 0734)  Complications: No notable events documented.

## 2023-02-25 NOTE — Progress Notes (Signed)
LABOR NOTE   Lorraine Paul 32 y.o.GP@ at [redacted]w[redacted]d  SUBJECTIVE:  Patient partially comfortable with her epidural.  She does still feel some contractions.  She said it has been a long night but is in better spirits this morning. Analgesia: Epidural  OBJECTIVE:  BP (!) 116/58 (BP Location: Left Arm)   Pulse 67   Temp 97.7 F (36.5 C) (Oral)   Resp 18   Ht  (1.626 m)   Wt 98.9 kg   LMP 04/08/2022 (Approximate)   SpO2 97%   BMI 37.42 kg/m  Total I/O In: -  Out: 1400 [Urine:1400]  She has shown cervical change. CERVIX: 3 cm:  75%:   -3:   :    SVE:   Dilation: 3 Effacement (%): 70 Station: -3 Exam by:: Swanson CNM   Lorraine Paul CONTRACTIONS: regular, every 2 minutes FHR: Fetal heart tracing reviewed. Variability: Fair (1-6 bpm)     AROM - Clear fluid   IUPC placed   FSE placed  Labs: Lab Results  Component Value Date   WBC 8.8 02/24/2023   HGB 11.5 (L) 02/24/2023   HCT 37.3 02/24/2023   MCV 86.7 02/24/2023   PLT 156 02/24/2023    ASSESSMENT: 1) Labor curve reviewed.        Baby has a decreased variability no real accelerations through the night or this morning. Slow progress in labor      Principal Problem:   Encounter for induction of labor   PLAN:  Internal monitors -expect labor -increase Pitocin as necessary  Elonda Husky, M.D. 02/25/2023 8:11 AM

## 2023-02-26 LAB — CBC
HCT: 25.2 % — ABNORMAL LOW (ref 36.0–46.0)
Hemoglobin: 7.6 g/dL — ABNORMAL LOW (ref 12.0–15.0)
MCH: 26.8 pg (ref 26.0–34.0)
MCHC: 30.2 g/dL (ref 30.0–36.0)
MCV: 88.7 fL (ref 80.0–100.0)
Platelets: 111 10*3/uL — ABNORMAL LOW (ref 150–400)
RBC: 2.84 MIL/uL — ABNORMAL LOW (ref 3.87–5.11)
RDW: 18.8 % — ABNORMAL HIGH (ref 11.5–15.5)
WBC: 11 10*3/uL — ABNORMAL HIGH (ref 4.0–10.5)
nRBC: 0 % (ref 0.0–0.2)

## 2023-02-26 MED ORDER — LACTATED RINGERS IV SOLN: INTRAVENOUS | Status: DC

## 2023-02-26 MED ORDER — OXYCODONE-ACETAMINOPHEN 5-325 MG PO TABS: 1.0000 | ORAL_TABLET | ORAL | Status: DC | PRN

## 2023-02-26 MED ORDER — MENTHOL 3 MG MT LOZG: 1.0000 | LOZENGE | OROMUCOSAL | Status: DC | PRN

## 2023-02-26 MED ORDER — SERTRALINE HCL 100 MG PO TABS
100.0000 mg | ORAL_TABLET | Freq: Every day | ORAL | Status: DC
  Administered 2023-02-26 – 2023-02-27 (×2): 100 mg via ORAL
  Filled 2023-02-26 (×2): qty 1

## 2023-02-26 MED ORDER — ACETAMINOPHEN 500 MG PO TABS
1000.0000 mg | ORAL_TABLET | Freq: Four times a day (QID) | ORAL | Status: DC | PRN
  Administered 2023-02-26 – 2023-02-28 (×7): 1000 mg via ORAL
  Filled 2023-02-26 (×8): qty 2

## 2023-02-26 MED ORDER — ZOLPIDEM TARTRATE 5 MG PO TABS
5.0000 mg | ORAL_TABLET | Freq: Every evening | ORAL | Status: DC | PRN
  Administered 2023-02-27: 5 mg via ORAL
  Filled 2023-02-26: qty 1

## 2023-02-26 MED ORDER — IBUPROFEN 600 MG PO TABS
600.0000 mg | ORAL_TABLET | Freq: Four times a day (QID) | ORAL | Status: DC
  Administered 2023-02-26 – 2023-02-28 (×6): 600 mg via ORAL
  Filled 2023-02-26 (×7): qty 1

## 2023-02-26 MED ORDER — PRENATAL MULTIVITAMIN CH
1.0000 | ORAL_TABLET | Freq: Every day | ORAL | Status: DC
  Administered 2023-02-26 – 2023-02-27 (×2): 1 via ORAL
  Filled 2023-02-26 (×3): qty 1

## 2023-02-26 MED ORDER — DIPHENHYDRAMINE HCL 25 MG PO CAPS: 25.0000 mg | ORAL_CAPSULE | Freq: Four times a day (QID) | ORAL | Status: DC | PRN

## 2023-02-26 MED ORDER — OXYCODONE HCL 5 MG PO TABS
5.0000 mg | ORAL_TABLET | ORAL | Status: DC | PRN
  Administered 2023-02-26 – 2023-02-28 (×9): 5 mg via ORAL
  Filled 2023-02-26 (×9): qty 1

## 2023-02-26 MED ORDER — SIMETHICONE 80 MG PO CHEW
80.0000 mg | CHEWABLE_TABLET | Freq: Four times a day (QID) | ORAL | Status: DC
  Administered 2023-02-26 – 2023-02-28 (×8): 80 mg via ORAL
  Filled 2023-02-26 (×8): qty 1

## 2023-02-26 MED ORDER — OXYTOCIN-SODIUM CHLORIDE 30-0.9 UT/500ML-% IV SOLN: 2.5000 [IU]/h | INTRAVENOUS | Status: AC

## 2023-02-26 MED ORDER — GABAPENTIN 100 MG PO CAPS
200.0000 mg | ORAL_CAPSULE | Freq: Three times a day (TID) | ORAL | Status: DC
  Administered 2023-02-26 – 2023-02-28 (×6): 200 mg via ORAL
  Filled 2023-02-26 (×6): qty 2

## 2023-02-26 MED ORDER — OXYCODONE HCL 5 MG PO TABS
5.0000 mg | ORAL_TABLET | Freq: Once | ORAL | Status: AC
  Administered 2023-02-26: 5 mg via ORAL
  Filled 2023-02-26: qty 1

## 2023-02-26 MED ORDER — SENNOSIDES-DOCUSATE SODIUM 8.6-50 MG PO TABS
2.0000 | ORAL_TABLET | ORAL | Status: DC
  Administered 2023-02-26: 2 via ORAL
  Filled 2023-02-26 (×2): qty 2

## 2023-02-26 NOTE — Anesthesia Postprocedure Evaluation (Signed)
Anesthesia Post Note  Patient: Lorraine Paul  Procedure(s) Performed: CESAREAN SECTION  Patient location during evaluation: Mother Baby Anesthesia Type: Epidural Level of consciousness: oriented and awake and alert Pain management: pain level controlled Vital Signs Assessment: post-procedure vital signs reviewed and stable Respiratory status: spontaneous breathing and respiratory function stable Cardiovascular status: blood pressure returned to baseline and stable Postop Assessment: no headache, no backache and no apparent nausea or vomiting Anesthetic complications: no Comments: Has not yet attempted to ambulate.  Foley catheter remains in place.   No notable events documented.   Last Vitals:  Vitals:   02/26/23 0445 02/26/23 0830  BP: 118/68 111/66  Pulse: 60 73  Resp: 18 18  Temp: 37 C 37.3 C  SpO2: 96% 92%    Last Pain:  Vitals:   02/26/23 0854  TempSrc:   PainSc: 5                  Reed Breech

## 2023-02-26 NOTE — Clinical Social Work Maternal (Signed)
CLINICAL SOCIAL WORK MATERNAL/CHILD NOTE  Patient Details  Name: Lorraine Paul MRN: [id number] Date of Birth: [birth year]  Date:  02/26/2023  Clinical Social Worker Initiating Note:  Susa Simmonds, [place] Date/Time: Initiated:  02/26/23/1000     Child's Name:  Lorraine Paul   Biological Parents:  Mother, Father   Need for Interpreter:  None   Reason for Referral:  Other (Comment) Inocente Salles Scale)   Address:  [address] [place] [address]    Phone number:  [phone] (home)     Additional phone number:   Household Members/Support Persons (HM/SP):   Household Member/Support Person 1   HM/SP Name Relationship DOB or Age  HM/SP -1 [name] FOB    HM/SP -2        HM/SP -3        HM/SP -4        HM/SP -5        HM/SP -6        HM/SP -7        HM/SP -8          Natural Supports (not living in the home):  Friends, Immediate Family   Professional Supports: Case Manager/Social Worker (Kathreen Cosier, Health Department)   Employment: Self-employed   Type of Work: Pharmacologist   Education:  High school graduate   Homebound arranged:    Surveyor, quantity Resources:  Medicaid   Other Resources:  [facility]   Cultural/Religious Considerations Which May Impact Care:  NA  Strengths:  Ability to meet basic needs     Psychotropic Medications:         Pediatrician:    Currently looking for one   Pediatrician List:   [employer] [place]    [employer]    [facility]      Pediatrician Fax Number: [place]  Risk Factors/Current Problems:      Cognitive State:  Alert     Mood/Affect:  Calm     CSW Assessment: CSW Assessment: CSW received a consult for?MOB's Edinburgh Postnatal Depression Scale results   CSW spoke with MOB and explained CSW's role and reason for referral.   MOB reported she is hanging in their?post-delivery. MOB was alert,?appropriate?during the assessment.    Confirmed contact information for  MOB. MOB and Baby will be living with?FOB [name])?at discharge.    MOB reported she does not qualify for food stamps, but she does have WIC.    MOB stated she does not have a pediatrician picked out.    MOB reported she has a crib,?bassinet, car seat (new), clothing, diapers, and all other items needed for Baby.    MOB reported she has?a history of Bi-polar and anxiety that was diagnosed in 2014. MOB states that she spiraled when her ex-boyfriend passed away in her lap. MOB stated that her mother admitted her into an inpatient psychiatric facility due to the anniversary of his death a few years ago. MOB also stated she has a history of drug usage. 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 MOB states she sees social worker Kathreen Cosier with the health department. MOB understands when she is overwhelmed to reach out to her support system. MOB has support from her mother and FOB. MOB denied SI, HI, or DV. MOB said she will continue to follow-up with Marchelle Folks with the health department about her mental health.  CSW provided education and information sheets on PPD and SIDS. MOB verbalized understanding. CSW encouraged MOB to reach out to her Provider with any questions or needs for support or resources, even after discharge.    MOB denied any needs or questions. CSW encouraged MOB to reach out if any arise prior to discharge.    ?  CSW Plan/Description: No further barriers for discharge.  No Further Intervention Required/No Barriers to Discharge    Susa Simmonds, LCSWA 02/26/2023, 11:00 AM

## 2023-02-26 NOTE — Lactation Note (Addendum)
This note was copied from a baby's chart. Lactation Consultation Note  Patient Name: Lorraine Paul [id number] Date: 02/26/2023 Age:32 hours Reason for consult: Initial assessment;Term;NICU baby;Other (Comment);Primapara (initiate pumping, mom on methadone mainteneance) C/S.   Maternal Data [id number], Primary C/S for arrest of dilation. 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 Mom on zoloft, methadone, and is a smoker( nicotine:1/4 pack a day). Mom reports for the past 6 months she takes 2 benadryl at night to sleep. Her anxiety was keeping her from sleeping. Baby in SCN, problem list includes hypoxia, hypoglycemia, r/o sepsis, in utero drug exposure.  On initial visit assisted mom with initiating breastpumping. Mom reports her feeding plan is to provide breastmilk to ease baby's transition from methadone exposure prior to birth and then eventually wean the baby slowly from breastmilk and formula feed. She would like guidance how to do that and when is best to transition baby to formula. Discussed with mom taking benadryl nightly for sleep can impact her establishment of a milk supply however sleep is important for her. Recommended mom talk with her Provider about options if she is having difficulty sleeping. Per mom she hasn't had to take benadryl since delivery.  Has patient been taught Hand Expression?: Yes Does the patient have breastfeeding experience prior to this delivery?: No  Feeding Mother's Current Feeding Choice: Breast Milk and Formula   Lactation Tools Discussed/Used Tools: Pump Breast pump type: Double-Electric Breast Pump Pump Education: Setup, frequency, and cleaning;Milk Storage Reason for Pumping: Baby in SCN Pumping frequency: 8 times in 24 hours Pumped volume:  (drops of colostrum expressed)  Interventions Interventions: DEBP;Education;Hand express  Discharge Pump:  Personal (Mom reports she has an electric breastpump but she is unsure of brand.She will have the pump brought to her room .It is in her car.)  Consult Status Consult Status: Follow-up Date: 02/27/23 Follow-up type: In-patient  Update provided to both mom's and baby's care nurse.  Lorraine Paul 02/26/2023, 4:25 PM

## 2023-02-26 NOTE — Progress Notes (Signed)
Progress Note - Cesarean Delivery  Lorraine Paul is a 32 y.o. G3P1021 now PP day 1 s/p C-Section, Low Transverse .   Subjective:  Patient reports no problems with eating, bowel movements, voiding, or their wound  Not lightheaded  Objective:  Vital signs in last 24 hours: Temp:  [97.5 F (36.4 C)-98.7 F (37.1 C)] 98.6 F (37 C) (04/20 0445) Pulse Rate:  [59-73] 60 (04/20 0445) Resp:  [14-24] 18 (04/20 0445) BP: (86-144)/(49-102) 118/68 (04/20 0445) SpO2:  [90 %-100 %] 96 % (04/20 0445)  Physical Exam:  General: alert, cooperative, and no distress Lochia: appropriate Uterine Fundus: firm Incision: dressing intact    Data Review Recent Labs    02/24/23 1503 02/26/23 0547  HGB 11.5* 7.6*  HCT 37.3 25.2*    Assessment:  Principal Problem:   Encounter for induction of labor   Status post Cesarean section. Doing well postoperatively.   Hgb low but not yet symptomatic  Plan:       Continue current care.  Follow for signs of anemia  Elonda Husky, M.D. 02/26/2023 8:03 AM

## 2023-02-27 LAB — CBC
HCT: 28.8 % — ABNORMAL LOW (ref 36.0–46.0)
Hemoglobin: 8.7 g/dL — ABNORMAL LOW (ref 12.0–15.0)
MCH: 26.8 pg (ref 26.0–34.0)
MCHC: 30.2 g/dL (ref 30.0–36.0)
MCV: 88.6 fL (ref 80.0–100.0)
Platelets: 117 10*3/uL — ABNORMAL LOW (ref 150–400)
RBC: 3.25 MIL/uL — ABNORMAL LOW (ref 3.87–5.11)
RDW: 19.4 % — ABNORMAL HIGH (ref 11.5–15.5)
WBC: 9.3 10*3/uL (ref 4.0–10.5)
nRBC: 0 % (ref 0.0–0.2)

## 2023-02-27 MED ORDER — FERROUS SULFATE 325 (65 FE) MG PO TABS
325.0000 mg | ORAL_TABLET | Freq: Two times a day (BID) | ORAL | Status: DC
  Administered 2023-02-27 – 2023-02-28 (×2): 325 mg via ORAL
  Filled 2023-02-27 (×2): qty 1

## 2023-02-27 NOTE — Progress Notes (Signed)
Subjective: Postpartum Day 2: Cesarean Delivery Patient reports incisional pain, tolerating PO, + flatus, and no problems voiding.   She continues to be rather edematous. Has been visiting her baby in the NICU. Her bleeding is light. She reports that her pain level is higher.she is drinking caffeine to encourage diuresis.  Objective: Vital signs in last 24 hours: Temp:  [97.9 F (36.6 C)-98.7 F (37.1 C)] 98.7 F (37.1 C) (04/20 2328) Pulse Rate:  [60-72] 72 (04/20 2328) Resp:  [18-20] 18 (04/20 2328) BP: (114-132)/(66-67) 132/66 (04/20 2328) SpO2:  [97 %] 97 % (04/20 2328)  Physical Exam:  General: cooperative, fatigued, no distress, and moderately obese Lochia: appropriate Uterine Fundus: firm. She is mildly distended Incision: healing well, no significant drainage, no dehiscence, honeycomb dressing is in place DVT Evaluation: No evidence of DVT seen on physical exam. Negative Homan's sign. Calf/Ankle edema is present. She is 3+ pitting edema in her legs, ankles and feet. Low H and H  Recent Labs    02/24/23 1503 02/26/23 0547  HGB 11.5* 7.6*  HCT 37.3 25.2*    Assessment/Plan: Status post Cesarean section. Postoperative course complicated by low iron and edema   CBC ordered. Discussed her edema with Dr. Logan Bores. Will not use lasix at this time If her Hgb is 8 or less, will likely order iron infusion.  Mirna Mires, CNM 02/27/2023, 10:43 AM

## 2023-02-28 ENCOUNTER — Telehealth: Payer: Self-pay

## 2023-02-28 ENCOUNTER — Ambulatory Visit (HOSPITAL_COMMUNITY): Payer: Self-pay

## 2023-02-28 ENCOUNTER — Encounter: Payer: Self-pay | Admitting: Licensed Practical Nurse

## 2023-02-28 MED ORDER — FERROUS SULFATE 325 (65 FE) MG PO TABS: 325.0000 mg | ORAL_TABLET | Freq: Every day | ORAL | 3 refills | Status: DC

## 2023-02-28 MED ORDER — IBUPROFEN 600 MG PO TABS: 600.0000 mg | ORAL_TABLET | Freq: Four times a day (QID) | ORAL | 0 refills | Status: DC

## 2023-02-28 MED ORDER — GABAPENTIN 100 MG PO CAPS: 200.0000 mg | ORAL_CAPSULE | Freq: Three times a day (TID) | ORAL | 0 refills | Status: DC

## 2023-02-28 MED ORDER — OXYCODONE HCL 5 MG PO TABS: 5.0000 mg | ORAL_TABLET | ORAL | 0 refills | Status: DC | PRN

## 2023-02-28 MED FILL — Fentanyl Citrate Preservative Free (PF) Inj 100 MCG/2ML: INTRAMUSCULAR | Qty: 50 | Status: AC

## 2023-02-28 NOTE — Lactation Note (Signed)
This note was copied from a baby's chart.  NICU Lactation Consultation Note  Patient Name: Lorraine Paul [id number] Date: 02/28/2023 Age:32 hours  Reason for consult: Initial assessment; NICU baby; Exclusive pumping and bottle feeding; Primapara; Term; Other (Comment) (Maternal SUD on Methadone)  SUBJECTIVE  LC in to visit with P1 Mom of term baby delivered by C/Section.  Baby was delivered at [facility] and treated for TTN and then transferred to [facility] NICU for seizure activity.  Baby is currently NPO and getting an EEG.  Parents here after Mom's discharge.  Mom states she would be willing to pump.  LC set up DEBP and assisted Mom to pump.  Mom states the last time she pumped she expressed 8 ml.  Breasts are filling, not engorged.  Engorgement prevention and treatment reviewed.  Mom encouraged to pump every 2-3 hrs when she is awake, and at least every 4 hrs at night. Mom does not plan to direct breastfeed, she wants to give baby her EBM to help him through this time in the NICU.  OBJECTIVE Infant data: Mother's Current Feeding Choice: Breast Milk and Formula  Infant feeding assessment No data recorded  Maternal data: G3P1021  C-Section, Low Transverse Has patient been taught Hand Expression?: Yes Hand Expression Comments: drops Current breast feeding challenges:: ++ breast changes Does the patient have breastfeeding experience prior to this delivery?: No Pumping frequency: Mom has pumped 4 times since birth Pumped volume: 8 mL Flange Size: 24  Pump: Personal, DEBP ([employer] Luna)  ASSESSMENT Infant: Feeding Status: NPO  Maternal: Milk volume: Normal  INTERVENTIONS/PLAN Interventions: Interventions: Breast feeding basics reviewed; Breast massage; Hand express; DEBP; Education Discharge Education: Engorgement and breast care Tools: Pump; Flanges Pump Education: Setup, frequency, and cleaning; Milk Storage  Plan: Consult Status: NICU follow-up NICU Follow-up type: New  admission follow up; Verify onset of copious milk; Verify absence of engorgement   Lorraine Paul 02/28/2023, 3:01 PM

## 2023-02-28 NOTE — Discharge Summary (Signed)
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 yo [id number] at [redacted]w[redacted]d was admitted to the hospital 02/24/2023 for induction of labor. Patient had a labor course significant for persistent Cat II strip and very slow labor progress and numerous period arrest of dilation and failure to descend. The patient went for cesarean section due to Arrest of Dilation, Arrest of Descent, and Non-Reassuring FHR. Delivery details are as follows: Membrane Rupture Time/Date: 7:40 AM ,02/25/2023   Delivery Method:C-Section, Low Transverse  Details of operation can be found in separate operative Note.    Newborn Data: Birth date:02/25/2023  Birth time:10:10 PM  Gender:Female  Living status:Living  Apgars:7 ,8  Weight:3910 g                                Subjective:   Pt. Is eating, hydrating, and voiding regularly without difficulty. Has had a BM. Baby is in special care nursery do to withdrawal symptoms from methadone. Lorraine Paul has been pumping although milk has not yet come in. Baby is receiving formula.  Reports small amount of vaginal bleeding, denies passing large blood clots. Has had cramping abdomen pain and right sided incisional pain. Pain is controlled with tylenol, ibuprofen,   oxycodone and gabapentin.  Plans to use nexplanon for contraception. Denies anxiety/depression symptoms. Endorses good support from partner and family.      Physical exam  Vitals:   02/27/23 1055 02/27/23 1515 02/27/23 2314 02/28/23 0805  BP: 121/78 (!) 117/53 114/71 111/69  Pulse: 69 (!) 59 71 65  Resp: Temp: 97.9 F (36.6 C) 97.8 F (36.6 C) 98.1 F (36.7 C) 97.7 F (36.5 C)  TempSrc:   Oral Axillary  SpO2: 97% 96% 97% 95%  Weight:      Height:       General: alert, cooperative, and no distress Breast: soft, non-tender, nipples without breakdown Lochia: appropriate Uterine Fundus: firm Incision: honey comb dressing on, wound margins appear approximated, slight serous drainage visible on left side of dressing, no surround erythema/edema,/brusing or exudate Perineum: no erythema or foul odor discharge, minimal edema DVT Evaluation: Negative Homan's sign. +pitting edema bilaterally, pedal pulses +2  Labs: Lab Results  Component Value Date   WBC 9.3 02/27/2023   HGB  8.7 (L) 02/27/2023   HCT 28.8 (L) 02/27/2023   MCV 88.6 02/27/2023   PLT 117 (L) 02/27/2023    Discharge instruction: in After Visit Summary.  Medications:  Allergies as of 02/28/2023       Reactions   Other Anaphylaxis   Bees, wasps, etc.   Latex Rash   Yeast inf as well        Medication List     STOP taking these medications    diphenhydrAMINE 25 mg capsule Commonly known as: BENADRYL       TAKE these medications    ferrous sulfate 325 (65 FE) MG tablet Take 1 tablet (325 mg total) by mouth daily with breakfast.   gabapentin 100 MG capsule Commonly known as: NEURONTIN Take 2 capsules (200 mg total) by mouth 3 (three) times daily.   ibuprofen 600 MG tablet Commonly known as: ADVIL Take 1 tablet (600 mg total) by mouth every 6 (six) hours.   methadone 10 MG/ML solution Commonly known as: DOLOPHINE Take 190 mg by mouth daily. Veriried with [facility] that  current dose is 190 mg QD   One A Day Prenatal 0.4-25 MG Chew Chew 1 capsule by mouth daily.   oxyCODONE 5 MG immediate release tablet Commonly known as: Oxy IR/ROXICODONE Take 1 tablet (5 mg total) by mouth every 4 (four) hours as needed for severe pain.   sertraline 100 MG tablet Commonly known as: Zoloft Take 1 tablet (100 mg total) by mouth daily.   Tylenol 325 MG tablet Generic drug: acetaminophen Take 1 tablet by mouth as needed.         Activity: Advance as tolerated. Pelvic rest for 6 weeks.   Outpatient follow up:  Follow-up Information     [facility] Follow up.   Specialty: Obstetrics and Gynecology Why: make a two week and six week in person postpartum visits Contact information: [address] [place] [address] [phone]                  Postpartum contraception: Nexplanon Rhogam Given postpartum: not indicated  Rubella vaccine given postpartum: not indicated Varicella vaccine given postpartum: not indicated TDaP given antepartum or postpartum: received 11/24/22 prenatally  Newborn Data: Live born female  Birth Weight: 8 lb 9.9 oz (3910 g) APGAR: 7, 8  Newborn Delivery   Birth date/time: 02/25/2023 22:10:00 Delivery type: C-Section, Low Transverse Trial of labor: Yes C-section categorization: Primary       Baby Feeding: Breast  Disposition:NICU   Raeford Razor, CNM, FNP 02/28/2023 11:11 AM

## 2023-02-28 NOTE — Progress Notes (Signed)
Pt discharged with infant. Discharge instructions, prescriptions, and follow up appointments given to and reviewed with patient. Pt verbalized understanding. Escorted out by auxillary.  

## 2023-03-01 ENCOUNTER — Encounter: Payer: Self-pay | Admitting: Obstetrics and Gynecology

## 2023-03-01 ENCOUNTER — Other Ambulatory Visit (HOSPITAL_BASED_OUTPATIENT_CLINIC_OR_DEPARTMENT_OTHER): Payer: Self-pay

## 2023-03-01 ENCOUNTER — Other Ambulatory Visit: Payer: Self-pay

## 2023-03-01 ENCOUNTER — Other Ambulatory Visit: Payer: Self-pay | Admitting: Licensed Practical Nurse

## 2023-03-01 DIAGNOSIS — Z98891 History of uterine scar from previous surgery: Secondary | ICD-10-CM

## 2023-03-01 DIAGNOSIS — Z515 Encounter for palliative care: Secondary | ICD-10-CM | POA: Insufficient documentation

## 2023-03-01 MED ORDER — OXYCODONE HCL 5 MG PO TABS
5.0000 mg | ORAL_TABLET | Freq: Four times a day (QID) | ORAL | 0 refills | Status: DC | PRN
Start: 2023-03-01 — End: 2023-03-15

## 2023-03-01 NOTE — Telephone Encounter (Signed)
Pt notified via mychart

## 2023-03-01 NOTE — Progress Notes (Signed)
[facility] [address] unable to [facility]-out of stock. Script canceled.  [facility] on [address] sent. Carie Caddy, CNM  [facility]  03/01/23  4:53 PM

## 2023-03-12 ENCOUNTER — Ambulatory Visit (HOSPITAL_COMMUNITY): Payer: Self-pay

## 2023-03-12 NOTE — Lactation Note (Signed)
This note was copied from a baby's chart.  NICU Lactation Consultation Note  Patient Name: Lorraine Paul ZOXWR'U Date: 03/12/2023 Age:32 wk.o.  Reason for consult: Weekly NICU follow-up; Exclusive pumping and bottle feeding; Other (Comment); Primapara; 1st time breastfeeding; NICU baby; Term; Maternal endocrine disorder (Transfer from [facility], maternal SUD on methadone, NAS/NOWS) Type of Endocrine Disorder?: PCOS (Hx of ovarian cyst)  SUBJECTIVE Visited with family of 32 weeks old FT NICU female; Lorraine Paul is a P1 and reports she's still pumping but not consistently. Explained the importance of consistent pumping to protect her supply, she voiced that pumping is more of a "hit or miss" she's trying to do most of her pumping during the day because she's having trouble sleeping. Reviewed pumping schedule, pump settings, lactogenesis III and anticipatory guidelines.   OBJECTIVE Infant data: Mother's Current Feeding Choice: Breast Milk and Formula  Infant feeding assessment Scale for Readiness: 3   Maternal data: G3P1021  C-Section, Low Transverse Significant Breast History:: (++) breast changes during the pregnancy Current breast feeding challenges:: NICU admission Pumping frequency: 4 times/24 hours Pumped volume: 80 mL Flange Size: 24 Risk factor for low milk supply:: primipara, infant separation, decreased pumping as 03/12/2023  Pump: Personal, DEBP (Motif Luna)  ASSESSMENT Infant: Feeding Status: Scheduled 9-12-3-6  Maternal: Milk volume: Low  INTERVENTIONS/PLAN Interventions: Interventions: Breast feeding basics reviewed; DEBP; Education Tools: Flanges; Pump Pump Education: Setup, frequency, and cleaning; Milk Storage  Plan: Encouraged to pump on maintenance mode every 3 hours, ideally 8 pumping sessions/24 hours Power pumping in the AM was also encouraged  FOB present and supportive. All questions and concerns answered, family to contact [facility] services PRN.  Consult  Status: NICU follow-up NICU Follow-up type: Weekly NICU follow up   Lorraine Paul 03/12/2023, 4:09 PM

## 2023-03-15 ENCOUNTER — Encounter: Payer: Self-pay | Admitting: Obstetrics and Gynecology

## 2023-03-15 ENCOUNTER — Ambulatory Visit (INDEPENDENT_AMBULATORY_CARE_PROVIDER_SITE_OTHER): Payer: Medicaid Other | Admitting: Obstetrics and Gynecology

## 2023-03-15 VITALS — BP 101/67 | HR 76 | Ht 64.0 in | Wt 202.6 lb

## 2023-03-15 DIAGNOSIS — Z9889 Other specified postprocedural states: Secondary | ICD-10-CM

## 2023-03-15 DIAGNOSIS — F53 Postpartum depression: Secondary | ICD-10-CM | POA: Diagnosis not present

## 2023-03-15 MED ORDER — SERTRALINE HCL 50 MG PO TABS: 150.0000 mg | ORAL_TABLET | Freq: Every day | ORAL | 1 refills | Status: DC

## 2023-03-15 MED ORDER — GABAPENTIN 100 MG PO CAPS: 200.0000 mg | ORAL_CAPSULE | Freq: Three times a day (TID) | ORAL | 1 refills | Status: DC

## 2023-03-15 NOTE — Progress Notes (Signed)
Patient presents today for 2 week postpartum follow-up. Patient had a cesarean delivery on 02/25/23. She is pumping while baby remains in the NICU. Patient has complaints of bilateral leg edema, hives and nerve pain in her back and down her leg. She states she would like an IUD for birth control. EPDS score of 27 .

## 2023-03-15 NOTE — Progress Notes (Signed)
HPI:      [name] is a 32 y.o. [phone] who LMP was No LMP recorded.  Subjective:   She presents today approximately 3 weeks postop and postpartum.  Her baby remains in the NICU.  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  He was making good progress until today they found some blood in his stool and now they have suspended feedings. Lorraine Paul is understandably somewhat depressed about this and her first Mother's Day as well as some other physical problems. She has sciatica that she has had since she was a teenager but and now appears to have reoccurred since her pregnancy.  She says that gabapentin was helping but she is now out of that medication.   She continues to have some lower extremity edema. She continues to take Zoloft daily for postpartum depression but says that that does not seem to be working well.  She has previously taken multiple other antidepressants during her life and says most of the time they do not work.     Hx: The following portions of the patient's history were reviewed and updated as appropriate:             She  has a past medical history of Mental disorder, Ovarian cyst, Periodontal disease, and Skin infection (11/08/2014). She does not have any pertinent problems on file. She  has a past surgical history that includes Ovarian cyst removal; I & D extremity (Left, 04/08/2016); Wisdom tooth extraction; Multiple tooth extractions; Tonsillectomy; and Cesarean section (02/25/2023). Her family history includes Alcoholism in her paternal grandfather and paternal grandmother; Diabetes in her maternal grandmother; Heart disease in her maternal grandmother; Other in her paternal grandfather. She  reports that she has been smoking cigarettes. She has been smoking an average of .25 packs per day. She has never used smokeless tobacco. She reports that she does not currently use alcohol. She reports that she does not currently use drugs after having used the  following drugs: IV. She has a current medication list which includes the following prescription(s): acetaminophen, ferrous sulfate, ibuprofen, methadone, one a day prenatal, and gabapentin. She is allergic to other and latex.       Review of Systems:  Review of Systems  Constitutional: Denied constitutional symptoms, night sweats, recent illness, fatigue, fever, insomnia and weight loss.  Eyes: Denied eye symptoms, eye pain, photophobia, vision change and visual disturbance.  Ears/Nose/Throat/Neck: Denied ear, nose, throat or neck symptoms, hearing loss, nasal discharge, sinus congestion and sore throat.  Cardiovascular: Denied cardiovascular symptoms, arrhythmia, chest pain/pressure, edema, exercise intolerance, orthopnea and palpitations.  Respiratory: Denied pulmonary symptoms, asthma, pleuritic pain, productive sputum, cough, dyspnea and wheezing.  Gastrointestinal: Denied, gastro-esophageal reflux, melena, nausea and vomiting.  Genitourinary: Denied genitourinary symptoms including symptomatic vaginal discharge, pelvic relaxation issues, and urinary complaints.  Musculoskeletal: See HPI for additional information.  Dermatologic: Denied dermatology symptoms, rash and scar.  Neurologic: Denied neurology symptoms, dizziness, headache, neck pain and syncope.  Psychiatric: See HPI for additional information.  Endocrine: Denied endocrine symptoms including hot flashes and night sweats.   Meds:   Current Outpatient Medications on File Prior to Visit  Medication Sig Dispense Refill   acetaminophen (TYLENOL) 325 MG tablet Take 1 tablet by mouth as needed.     ferrous sulfate 325 (65 FE) MG tablet Take 1 tablet (325 mg total) by mouth daily with breakfast. 30 tablet 3   ibuprofen (ADVIL) 600 MG tablet Take 1 tablet (600 mg total)  by mouth every 6 (six) hours. 30 tablet 0   methadone (DOLOPHINE) 10 MG/ML solution Take 190 mg by mouth daily. Veriried with [facility] that current  dose is 190 mg QD     Prenatal MV & Min w/FA-DHA (ONE A DAY PRENATAL) 0.4-25 MG CHEW Chew 1 capsule by mouth daily.     No current facility-administered medications on file prior to visit.      Objective:     Vitals:   03/15/23 1444  BP: 101/67  Pulse: 76   Filed Weights   03/15/23 1444  Weight: 202 lb 9.6 oz (91.9 kg)               Abdomen: Soft.  Non-tender.  No masses.  No HSM.  Incision/s: Intact.  Healing well.  No erythema.  No drainage.             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 Postoperative state   2. Postpartum care following cesarean delivery   3. Postpartum depression     Very high postpartum depression scores (patient reports that she is with other people during the day especially her mother and has no intent of self-harm.)   Plan:            1.  Increase Zoloft to 150 daily -close follow-up  2.  Stretching exercises for sciatica discussed.  3.  Restart gabapentin Orders No orders of the defined types were placed in this encounter.    Meds ordered this encounter  Medications   gabapentin (NEURONTIN) 100 MG capsule    Sig: Take 2 capsules (200 mg total) by mouth 3 (three) times daily.    Dispense:  30 capsule    Refill:  1      F/U  Return in about 2 weeks (around 03/29/2023).  Elonda Husky, M.D. 03/15/2023 3:27 PM

## 2023-03-21 ENCOUNTER — Telehealth: Payer: Self-pay

## 2023-03-30 ENCOUNTER — Ambulatory Visit (HOSPITAL_COMMUNITY): Payer: Self-pay

## 2023-03-30 NOTE — Lactation Note (Signed)
This note was copied from a baby's chart.  NICU Lactation Consultation Note  Patient Name: Lorraine Paul ZOXWR'U Date: 03/30/2023 Age:32 wk.o.  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 Feser is a P1 and reports she's still pumping, but skipping her night pumping sessions. Noticed that last breastmilk feeding mixed with formula was given on 03/27/2023. She still undecided about taking baby to breast and plans to pump and bottle feed for now. Baby is getting a G-tube tomorrow; she had questions regarding methadone and breastmilk. Let her know that according to the Academy of breastfeeding medicine, maternal methadone is not a contraindication to breastfeed; baby "Lorraine Paul" is getting mostly formula. Reviewed pumping schedule, lactogenesis III, strategies to increase supply and anticipatory guidelines.  OBJECTIVE Infant data: Mother's Current Feeding Choice: Breast Milk and Formula  Infant feeding assessment Scale for Readiness: 3 (infant agitated/crying with care, no interest in paci or hunger cues) Scale for Quality: 5 (attempted to offer bottle, infant uncordinated, unable to latch)   Maternal data: G3P1021  C-Section, Low Transverse Pumping frequency: 6 times/24 hours Pumped volume: 20 mL Flange Size: 24  Pump: Personal, DEBP (Motif Luna)  ASSESSMENT Infant: Feeding Status: Scheduled 8-11-2-5  Maternal: Milk volume: Low  INTERVENTIONS/PLAN Interventions: Interventions: Breast feeding basics reviewed; DEBP; Education Tools: Pump; Flanges Pump Education: Setup, frequency, and cleaning; Milk Storage  Plan: Encouraged to pump on maintenance mode every 3 hours, ideally 8 pumping sessions/24  hours Power pumping in the AM was also encouraged She'll call for latch assistance if she decides to take baby to breast   FOB present and supportive. All questions and concerns answered, family to contact [facility] services PRN.  Consult Status: NICU follow-up NICU Follow-up type: Weekly NICU follow up   Etherine Mackowiak Venetia Constable 03/30/2023, 5:44 PM

## 2023-03-31 ENCOUNTER — Ambulatory Visit: Payer: Medicaid Other | Admitting: Obstetrics and Gynecology

## 2023-03-31 DIAGNOSIS — F53 Postpartum depression: Secondary | ICD-10-CM

## 2023-03-31 DIAGNOSIS — Z9889 Other specified postprocedural states: Secondary | ICD-10-CM

## 2023-04-05 ENCOUNTER — Ambulatory Visit (INDEPENDENT_AMBULATORY_CARE_PROVIDER_SITE_OTHER): Payer: Medicaid Other | Admitting: Obstetrics and Gynecology

## 2023-04-05 ENCOUNTER — Encounter: Payer: Self-pay | Admitting: Obstetrics and Gynecology

## 2023-04-05 DIAGNOSIS — F53 Postpartum depression: Secondary | ICD-10-CM

## 2023-04-05 DIAGNOSIS — Z9889 Other specified postprocedural states: Secondary | ICD-10-CM

## 2023-04-05 MED ORDER — GABAPENTIN 100 MG PO CAPS
200.0000 mg | ORAL_CAPSULE | Freq: Three times a day (TID) | ORAL | 1 refills | Status: DC
Start: 2023-04-05 — End: 2023-07-08

## 2023-04-05 NOTE — Progress Notes (Signed)
Patient presents today for 6 week postpartum follow-up. Patient had a cesarean delivery on 02/24/23. Reports still having daily sciatic pain, taking pain medication accordingly. She is pumping while baby remains in NICU. She states she remains uncertain of what she would like for birth control. EPDS score of 13 . Reports she feels like her depression is better, continuing to take Zoloft  150 mg daily.

## 2023-04-05 NOTE — Progress Notes (Signed)
HPI:      [name] is a 32 y.o. [phone] who LMP was No LMP recorded.  Subjective:   She presents today for her 6-week postpartum visit.  She is doing much better on the Zoloft.  She reports that her mood has definitely improved and she is no longer concerned about her mental health.  She would like to continue the Zoloft.  She reports that her baby is also doing better.  He received a G-tube but she anticipates he may be coming home in the next week. She says her incision opened up when she stretched and climbed up into her truck on day.  She has put Steri-Strips on it at this time. She reports the gabapentin is still necessary for her sciatica pain down her right leg.    Hx: The following portions of the patient's history were reviewed and updated as appropriate:             She  has a past medical history of Mental disorder, Ovarian cyst, Periodontal disease, and Skin infection (11/08/2014). She does not have any pertinent problems on file. She  has a past surgical history that includes Ovarian cyst removal; I & D extremity (Left, 04/08/2016); Wisdom tooth extraction; Multiple tooth extractions; Tonsillectomy; and Cesarean section (02/25/2023). Her family history includes Alcoholism in her paternal grandfather and paternal grandmother; Diabetes in her maternal grandmother; Heart disease in her maternal grandmother; Other in her paternal grandfather. She  reports that she has been smoking cigarettes. She has been smoking an average of .25 packs per day. She has never used smokeless tobacco. She reports that she does not currently use alcohol. She reports that she does not currently use drugs after having used the following drugs: IV. She has a current medication list which includes the following prescription(s): acetaminophen, ferrous sulfate, ibuprofen, methadone, one a day prenatal, sertraline, and gabapentin. She is allergic to other and latex.       Review of Systems:  Review of  Systems  Constitutional: Denied constitutional symptoms, night sweats, recent illness, fatigue, fever, insomnia and weight loss.  Eyes: Denied eye symptoms, eye pain, photophobia, vision change and visual disturbance.  Ears/Nose/Throat/Neck: Denied ear, nose, throat or neck symptoms, hearing loss, nasal discharge, sinus congestion and sore throat.  Cardiovascular: Denied cardiovascular symptoms, arrhythmia, chest pain/pressure, edema, exercise intolerance, orthopnea and palpitations.  Respiratory: Denied pulmonary symptoms, asthma, pleuritic pain, productive sputum, cough, dyspnea and wheezing.  Gastrointestinal: Denied, gastro-esophageal reflux, melena, nausea and vomiting.  Genitourinary: Denied genitourinary symptoms including symptomatic vaginal discharge, pelvic relaxation issues, and urinary complaints.  Musculoskeletal: Denied musculoskeletal symptoms, stiffness, swelling, muscle weakness and myalgia.  Dermatologic: Denied dermatology symptoms, rash and scar.  Neurologic: Denied neurology symptoms, dizziness, headache, neck pain and syncope.  Psychiatric: Denied psychiatric symptoms, anxiety and depression.  Endocrine: Denied endocrine symptoms including hot flashes and night sweats.   Meds:   Current Outpatient Medications on File Prior to Visit  Medication Sig Dispense Refill   acetaminophen (TYLENOL) 325 MG tablet Take 1 tablet by mouth as needed.     ferrous sulfate 325 (65 FE) MG tablet Take 1 tablet (325 mg total) by mouth daily with breakfast. 30 tablet 3   ibuprofen (ADVIL) 600 MG tablet Take 1 tablet (600 mg total) by mouth every 6 (six) hours. 30 tablet 0   methadone (DOLOPHINE) 10 MG/ML solution Take 190 mg by mouth daily. Veriried with [facility] that current dose is 190 mg QD  Prenatal MV & Min w/FA-DHA (ONE A DAY PRENATAL) 0.4-25 MG CHEW Chew 1 capsule by mouth daily.     sertraline (ZOLOFT) 50 MG tablet Take 3 tablets (150 mg total) by mouth daily. 90  tablet 1   No current facility-administered medications on file prior to visit.      Objective:     There were no vitals filed for this visit. There were no vitals filed for this visit.             Abdomen: Soft.  Non-tender.  No masses.  No HSM.  Incision/s: Intact.  Healing well.  No erythema.  No drainage.  Small area very shallow opened on the left aspect of her incision.  Currently held in place by Steri-Strips.             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 Postpartum care following cesarean delivery   2. Postpartum depression   3. Postoperative state     Patient doing well postop.  Postpartum depression much improved on Zoloft!!  Expect incision to heal well.  Patient says that her leg feels much better on limited use of gabapentin   Plan:            1.  Wound care discussed  2.  Continue Zoloft  3.  Another short course of gabapentin. Orders No orders of the defined types were placed in this encounter.    Meds ordered this encounter  Medications   gabapentin (NEURONTIN) 100 MG capsule    Sig: Take 2 capsules (200 mg total) by mouth 3 (three) times daily.    Dispense:  30 capsule    Refill:  1      F/U  Return in about 4 weeks (around 05/03/2023).  Elonda Husky, M.D. 04/05/2023 9:16 AM

## 2023-05-03 ENCOUNTER — Encounter: Payer: Self-pay | Admitting: Obstetrics and Gynecology

## 2023-05-03 ENCOUNTER — Ambulatory Visit (INDEPENDENT_AMBULATORY_CARE_PROVIDER_SITE_OTHER): Payer: Medicaid Other | Admitting: Obstetrics and Gynecology

## 2023-05-03 ENCOUNTER — Other Ambulatory Visit: Payer: Self-pay

## 2023-05-03 DIAGNOSIS — Z9889 Other specified postprocedural states: Secondary | ICD-10-CM

## 2023-05-03 DIAGNOSIS — F53 Postpartum depression: Secondary | ICD-10-CM

## 2023-05-03 DIAGNOSIS — R600 Localized edema: Secondary | ICD-10-CM

## 2023-05-03 DIAGNOSIS — Z30011 Encounter for initial prescription of contraceptive pills: Secondary | ICD-10-CM

## 2023-05-03 DIAGNOSIS — M5431 Sciatica, right side: Secondary | ICD-10-CM

## 2023-05-03 MED ORDER — SERTRALINE HCL 50 MG PO TABS
150.0000 mg | ORAL_TABLET | Freq: Every day | ORAL | 1 refills | Status: DC
Start: 2023-05-03 — End: 2023-05-23

## 2023-05-03 MED ORDER — DESOGESTREL-ETHINYL ESTRADIOL 0.15-0.02/0.01 MG (21/5) PO TABS: 1.0000 | ORAL_TABLET | Freq: Every day | ORAL | 1 refills | Status: DC

## 2023-05-03 NOTE — Progress Notes (Signed)
HPI:      [name] is a 32 y.o. [phone] who LMP was No LMP recorded.  Subjective:   She presents today 2 months postpartum from cesarean delivery.  She reports that her incision is doing well.  She has been taking Zoloft and it is working for her for postpartum depression. She states that her baby is now at home and has been taking oral feedings.  G-tube going to be removed. She has 2 issues-the first is that she has significant but pain and pain down her leg consistent with sciatica.  She states that this has gotten worse over the last month.  In addition she complains of bilateral lower extremity edema which has not resolved.    Hx: The following portions of the patient's history were reviewed and updated as appropriate:             She  has a past medical history of Mental disorder, Ovarian cyst, Periodontal disease, and Skin infection (11/08/2014). She does not have any pertinent problems on file. She  has a past surgical history that includes Ovarian cyst removal; I & D extremity (Left, 04/08/2016); Wisdom tooth extraction; Multiple tooth extractions; Tonsillectomy; and Cesarean section (02/25/2023). Her family history includes Alcoholism in her paternal grandfather and paternal grandmother; Diabetes in her maternal grandmother; Heart disease in her maternal grandmother; Other in her paternal grandfather. She  reports that she has been smoking cigarettes. She has been smoking an average of .25 packs per day. She has never used smokeless tobacco. She reports that she does not currently use alcohol. She reports that she does not currently use drugs after having used the following drugs: IV. She has a current medication list which includes the following prescription(s): acetaminophen, desogestrel-ethinyl estradiol, ferrous sulfate, gabapentin, ibuprofen, methadone, one a day prenatal, and sertraline. She is allergic to other and latex.       Review of Systems:  Review of  Systems  Constitutional: Denied constitutional symptoms, night sweats, recent illness, fatigue, fever, insomnia and weight loss.  Eyes: Denied eye symptoms, eye pain, photophobia, vision change and visual disturbance.  Ears/Nose/Throat/Neck: Denied ear, nose, throat or neck symptoms, hearing loss, nasal discharge, sinus congestion and sore throat.  Cardiovascular: Denied cardiovascular symptoms, arrhythmia, chest pain/pressure, edema, exercise intolerance, orthopnea and palpitations.  Respiratory: Denied pulmonary symptoms, asthma, pleuritic pain, productive sputum, cough, dyspnea and wheezing.  Gastrointestinal: Denied, gastro-esophageal reflux, melena, nausea and vomiting.  Genitourinary: Denied genitourinary symptoms including symptomatic vaginal discharge, pelvic relaxation issues, and urinary complaints.  Musculoskeletal: See HPI for additional information.  Dermatologic: Denied dermatology symptoms, rash and scar.  Neurologic: Denied neurology symptoms, dizziness, headache, neck pain and syncope.  Psychiatric: Denied psychiatric symptoms, anxiety and depression.  Endocrine: See HPI for additional information.   Meds:   Current Outpatient Medications on File Prior to Visit  Medication Sig Dispense Refill   acetaminophen (TYLENOL) 325 MG tablet Take 1 tablet by mouth as needed.     ferrous sulfate 325 (65 FE) MG tablet Take 1 tablet (325 mg total) by mouth daily with breakfast. 30 tablet 3   gabapentin (NEURONTIN) 100 MG capsule Take 2 capsules (200 mg total) by mouth 3 (three) times daily. 30 capsule 1   ibuprofen (ADVIL) 600 MG tablet Take 1 tablet (600 mg total) by mouth every 6 (six) hours. 30 tablet 0   methadone (DOLOPHINE) 10 MG/ML solution Take 190 mg by mouth daily. Veriried with [facility] that current dose is 190 mg QD  Prenatal MV & Min w/FA-DHA (ONE A DAY PRENATAL) 0.4-25 MG CHEW Chew 1 capsule by mouth daily.     sertraline (ZOLOFT) 50 MG tablet Take 3  tablets (150 mg total) by mouth daily. 90 tablet 1   No current facility-administered medications on file prior to visit.      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 Postpartum care following cesarean delivery   2. Postoperative state   3. Sciatica of right side   4. Lower extremity edema   5. Initiation of OCP (BCP)     Patient doing well postoperatively.  Postpartum depression doing well on Zoloft  Sciatica seems to be getting worse  Patient has bilateral lower extremity edema that has not resolved in 2 months after delivery.   Plan:            1.  Referral to orthopedics for sciatica.  Possible future referral for lower extremity edema-vascular.  Patient has chosen orthopedics because she is having significant pain.  2.  Depression doing well on Zoloft-continue Zoloft  3.  Patient desires birth control -has chosen OCPs   OCPs The risks /benefits of OCPs have been explained to the patient in detail.  Product literature has been given to her where appropriate.  I have instructed her in the use of OCPs.  I have explained to the patient that OCPs are not as effective for birth control during the first month of use, and that another form of  contraception should be used during this time.  Both first-day start and Sunday start have been explained.  The risks and benefits of each was discussed.  She has been made aware of  the fact that in rare circumstances, other medications may affect the efficacy of OCPs.  I have answered all of her questions, and I believe that she has an understanding of the effectiveness and use of OCPs.  Orders Orders Placed This Encounter  Procedures   Ambulatory referral to Orthopedics     Meds ordered this encounter  Medications   desogestrel-ethinyl estradiol (MIRCETTE) 0.15-0.02/0.01 MG (21/5) tablet    Sig: Take 1 tablet by mouth at bedtime.    Dispense:  84 tablet    Refill:  1      F/U  No follow-ups on file. I spent 31 minutes involved in the care of this patient preparing to see the patient by obtaining and reviewing her medical history (including labs, imaging tests and prior procedures), documenting clinical information in the electronic health record (EHR), counseling and coordinating care plans, writing and sending prescriptions, ordering tests or procedures and in direct  communicating with the patient and medical staff discussing pertinent items from her history and physical exam.  Elonda Husky, M.D. 05/03/2023 10:46 AM

## 2023-05-03 NOTE — Progress Notes (Signed)
Patient presents today for 2 month postpartum follow-up. Patient had a cesarean delivery on 02/25/23.  She reports baby boy has come home and is bottle feeding. She states she would like discuss IUD vs OCP's for birth control. EPDS score of 14. Reports ongoing back pain that radiates to neck and feet as well as ankle swelling.

## 2023-05-04 ENCOUNTER — Telehealth: Payer: Self-pay | Admitting: Neonatology

## 2023-05-04 NOTE — Telephone Encounter (Signed)
NAS postpartum telephone consult follow-up. Followed [name] while [name] while inpatient. She was currently at the doctor's office with [name] and asked that I call her back at a later time.  Please encouraged Lorraine Paul to call NAS phone with any concerns or needs in the meantime.  [phone].    Jason Fila, NNP-BC

## 2023-05-23 ENCOUNTER — Other Ambulatory Visit: Payer: Self-pay | Admitting: Obstetrics and Gynecology

## 2023-05-23 DIAGNOSIS — F53 Postpartum depression: Secondary | ICD-10-CM

## 2023-05-26 ENCOUNTER — Telehealth: Payer: Self-pay | Admitting: Pediatrics

## 2023-05-26 ENCOUNTER — Ambulatory Visit (INDEPENDENT_AMBULATORY_CARE_PROVIDER_SITE_OTHER): Payer: Medicaid Other | Admitting: Obstetrics and Gynecology

## 2023-05-26 ENCOUNTER — Encounter: Payer: Self-pay | Admitting: Obstetrics and Gynecology

## 2023-05-26 VITALS — BP 129/78 | HR 79 | Ht 64.0 in | Wt 187.6 lb

## 2023-05-26 DIAGNOSIS — Z30430 Encounter for insertion of intrauterine contraceptive device: Secondary | ICD-10-CM

## 2023-05-26 MED ORDER — LEVONORGESTREL 20 MCG/DAY IU IUD
1.0000 | INTRAUTERINE_SYSTEM | Freq: Once | INTRAUTERINE | Status: AC
Start: 2023-05-26 — End: 2023-05-26
  Administered 2023-05-26: 1 via INTRAUTERINE

## 2023-05-26 NOTE — Telephone Encounter (Signed)
NAS postpartum telephone consult attempted without answer.  HIPAA compliant message left requesting a return call or text.  Will follow again on 8/16.

## 2023-05-26 NOTE — Progress Notes (Signed)
HPI:      [name] is a 32 y.o. [phone] who LMP was Patient's last menstrual period was 05/22/2023.  Subjective:   She presents today for IUD insertion.  She has been unhappy with her other birth control and would like an IUD.    Hx: The following portions of the patient's history were reviewed and updated as appropriate:             She  has a past medical history of Mental disorder, Ovarian cyst, Periodontal disease, and Skin infection (11/08/2014). She does not have any pertinent problems on file. She  has a past surgical history that includes Ovarian cyst removal; I & D extremity (Left, 04/08/2016); Wisdom tooth extraction; Multiple tooth extractions; Tonsillectomy; and Cesarean section (02/25/2023). Her family history includes Alcoholism in her paternal grandfather and paternal grandmother; Diabetes in her maternal grandmother; Heart disease in her maternal grandmother; Other in her paternal grandfather. She  reports that she has been smoking cigarettes. She has never used smokeless tobacco. She reports that she does not currently use alcohol. She reports that she does not currently use drugs after having used the following drugs: IV. She has a current medication list which includes the following prescription(s): levonorgestrel, acetaminophen, ferrous sulfate, gabapentin, ibuprofen, methadone, one a day prenatal, and sertraline. She is allergic to other and latex.       Review of Systems:  Review of Systems  Constitutional: Denied constitutional symptoms, night sweats, recent illness, fatigue, fever, insomnia and weight loss.  Eyes: Denied eye symptoms, eye pain, photophobia, vision change and visual disturbance.  Ears/Nose/Throat/Neck: Denied ear, nose, throat or neck symptoms, hearing loss, nasal discharge, sinus congestion and sore throat.  Cardiovascular: Denied cardiovascular symptoms, arrhythmia, chest pain/pressure, edema, exercise intolerance, orthopnea and palpitations.   Respiratory: Denied pulmonary symptoms, asthma, pleuritic pain, productive sputum, cough, dyspnea and wheezing.  Gastrointestinal: Denied, gastro-esophageal reflux, melena, nausea and vomiting.  Genitourinary: Denied genitourinary symptoms including symptomatic vaginal discharge, pelvic relaxation issues, and urinary complaints.  Musculoskeletal: Denied musculoskeletal symptoms, stiffness, swelling, muscle weakness and myalgia.  Dermatologic: Denied dermatology symptoms, rash and scar.  Neurologic: Denied neurology symptoms, dizziness, headache, neck pain and syncope.  Psychiatric: Denied psychiatric symptoms, anxiety and depression.  Endocrine: Denied endocrine symptoms including hot flashes and night sweats.   Meds:   Current Outpatient Medications on File Prior to Visit  Medication Sig Dispense Refill   levonorgestrel (MIRENA) 20 MCG/DAY IUD 1 each by Intrauterine route once.     acetaminophen (TYLENOL) 325 MG tablet Take 1 tablet by mouth as needed.     ferrous sulfate 325 (65 FE) MG tablet Take 1 tablet (325 mg total) by mouth daily with breakfast. 30 tablet 3   gabapentin (NEURONTIN) 100 MG capsule Take 2 capsules (200 mg total) by mouth 3 (three) times daily. 30 capsule 1   ibuprofen (ADVIL) 600 MG tablet Take 1 tablet (600 mg total) by mouth every 6 (six) hours. 30 tablet 0   methadone (DOLOPHINE) 10 MG/ML solution Take 190 mg by mouth daily. Veriried with [facility] that current dose is 190 mg QD     Prenatal MV & Min w/FA-DHA (ONE A DAY PRENATAL) 0.4-25 MG CHEW Chew 1 capsule by mouth daily.     sertraline (ZOLOFT) 50 MG tablet TAKE 3 TABLETS BY MOUTH DAILY 90 tablet 1   No current facility-administered medications on file prior to visit.    Objective:     Vitals:   05/26/23 0908  BP:  129/78  Pulse: 79    Physical examination   Pelvic:   Vulva: Normal appearance.  No lesions.  Vagina: No lesions or abnormalities noted.  Support: Normal pelvic support.   Urethra No masses tenderness or scarring.  Meatus Normal size without lesions or prolapse.  Cervix: Normal appearance.  No lesions.  Anus: Normal exam.  No lesions.  Perineum: Normal exam.  No lesions.        Bimanual   Uterus: Normal size.  Non-tender.  Mobile.  AV.  Adnexae: No masses.  Non-tender to palpation.  Cul-de-sac: Negative for abnormality.   IUD Procedure Pt has read the booklet and signed the appropriate forms regarding the Mirena IUD.  All of her questions have been answered.   The cervix was cleansed with betadine solution.  After sounding the uterus and noting the position, the IUD was placed in the usual manner without problem.  The string was cut to the appropriate length.  The patient tolerated the procedure well.            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 Encounter for IUD insertion       Plan:             F/U  Return in about 4 weeks (around 06/23/2023) for For IUD f/u.  Elonda Husky, M.D. 05/26/2023 9:25 AM

## 2023-05-26 NOTE — Progress Notes (Unsigned)
Patient presents today for IUD insertion. She states no other questions or concerns.   

## 2023-05-27 DIAGNOSIS — M5416 Radiculopathy, lumbar region: Secondary | ICD-10-CM | POA: Diagnosis not present

## 2023-05-27 DIAGNOSIS — M545 Low back pain, unspecified: Secondary | ICD-10-CM | POA: Diagnosis not present

## 2023-06-23 ENCOUNTER — Ambulatory Visit (INDEPENDENT_AMBULATORY_CARE_PROVIDER_SITE_OTHER): Payer: Medicaid Other | Admitting: Obstetrics and Gynecology

## 2023-06-23 ENCOUNTER — Other Ambulatory Visit: Payer: Self-pay | Admitting: Obstetrics and Gynecology

## 2023-06-23 ENCOUNTER — Encounter: Payer: Self-pay | Admitting: Obstetrics and Gynecology

## 2023-06-23 VITALS — BP 107/69 | HR 65 | Ht 64.0 in | Wt 192.4 lb

## 2023-06-23 DIAGNOSIS — F53 Postpartum depression: Secondary | ICD-10-CM

## 2023-06-23 DIAGNOSIS — Z30431 Encounter for routine checking of intrauterine contraceptive device: Secondary | ICD-10-CM | POA: Diagnosis not present

## 2023-06-23 MED ORDER — SERTRALINE HCL 50 MG PO TABS
150.0000 mg | ORAL_TABLET | Freq: Every day | ORAL | 1 refills | Status: DC
Start: 2023-06-23 — End: 2023-06-23

## 2023-06-23 NOTE — Progress Notes (Signed)
HPI:      [name] is a 32 y.o. [phone] who LMP was Patient's last menstrual period was 05/22/2023.  Subjective:   She presents today for follow-up of her IUD.  She states that she is generally not doing well.  She has i self ncreased her Zoloft to 200 milligrams.  She reports that she is doing better since this increase.  She would like to stay on 200 daily.  In addition she continues on methadone.  She states that she has had counseling in the past but recently has been unable to get into see a counselor. She does report that her IUD is doing well she has no bleeding with that and has experienced no other problems from it. She reports the baby is doing well without issue.    Hx: The following portions of the patient's history were reviewed and updated as appropriate:             She  has a past medical history of Mental disorder, Ovarian cyst, Periodontal disease, and Skin infection (11/08/2014). She does not have any pertinent problems on file. She  has a past surgical history that includes Ovarian cyst removal; I & D extremity (Left, 04/08/2016); Wisdom tooth extraction; Multiple tooth extractions; Tonsillectomy; and Cesarean section (02/25/2023). Her family history includes Alcoholism in her paternal grandfather and paternal grandmother; Diabetes in her maternal grandmother; Heart disease in her maternal grandmother; Other in her paternal grandfather. She  reports that she has been smoking cigarettes. She has never used smokeless tobacco. She reports that she does not currently use alcohol. She reports that she does not currently use drugs after having used the following drugs: IV. She has a current medication list which includes the following prescription(s): acetaminophen, ferrous sulfate, gabapentin, ibuprofen, levonorgestrel, methadone, one a day prenatal, and sertraline. She is allergic to other and latex.       Review of Systems:  Review of Systems  Constitutional: Denied  constitutional symptoms, night sweats, recent illness, fatigue, fever, insomnia and weight loss.  Eyes: Denied eye symptoms, eye pain, photophobia, vision change and visual disturbance.  Ears/Nose/Throat/Neck: Denied ear, nose, throat or neck symptoms, hearing loss, nasal discharge, sinus congestion and sore throat.  Cardiovascular: Denied cardiovascular symptoms, arrhythmia, chest pain/pressure, edema, exercise intolerance, orthopnea and palpitations.  Respiratory: Denied pulmonary symptoms, asthma, pleuritic pain, productive sputum, cough, dyspnea and wheezing.  Gastrointestinal: Denied, gastro-esophageal reflux, melena, nausea and vomiting.  Genitourinary:   Musculoskeletal: Denied musculoskeletal symptoms, stiffness, swelling, muscle weakness and myalgia.  Dermatologic: Denied dermatology symptoms, rash and scar.  Neurologic: Denied neurology symptoms, dizziness, headache, neck pain and syncope.  Psychiatric: Denied psychiatric symptoms, anxiety and depression.  Endocrine: Denied endocrine symptoms including hot flashes and night sweats.   Meds:   Current Outpatient Medications on File Prior to Visit  Medication Sig Dispense Refill   acetaminophen (TYLENOL) 325 MG tablet Take 1 tablet by mouth as needed.     ferrous sulfate 325 (65 FE) MG tablet Take 1 tablet (325 mg total) by mouth daily with breakfast. 30 tablet 3   gabapentin (NEURONTIN) 100 MG capsule Take 2 capsules (200 mg total) by mouth 3 (three) times daily. 30 capsule 1   ibuprofen (ADVIL) 600 MG tablet Take 1 tablet (600 mg total) by mouth every 6 (six) hours. 30 tablet 0   levonorgestrel (MIRENA) 20 MCG/DAY IUD 1 each by Intrauterine route once.     methadone (DOLOPHINE) 10 MG/ML solution Take 190 mg by mouth daily. Veriried  with [facility] that current dose is 190 mg QD     Prenatal MV & Min w/FA-DHA (ONE A DAY PRENATAL) 0.4-25 MG CHEW Chew 1 capsule by mouth daily.     sertraline (ZOLOFT) 50 MG tablet TAKE 3  TABLETS BY MOUTH DAILY 90 tablet 1   No current facility-administered medications on file prior to visit.      Objective:     Vitals:   06/23/23 1505  BP: 107/69  Pulse: 65   Filed Weights   06/23/23 1505  Weight: 192 lb 6.4 oz (87.3 kg)              Physical examination   Pelvic:   Vulva: Normal appearance.  No lesions.  Vagina: No lesions or abnormalities noted.  Support: Normal pelvic support.  Urethra No masses tenderness or scarring.  Meatus Normal size without lesions or prolapse.  Cervix: Normal appearance.  No lesions. IUD strings noted at cervical os.  Anus: Normal exam.  No lesions.  Perineum: Normal exam.  No lesions.        Bimanual   Uterus: Normal size.  Non-tender.  Mobile.  AV.  Adnexae: No masses.  Non-tender to palpation.  Cul-de-sac: Negative for abnormality.             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 Encounter for routine checking of intrauterine contraceptive device (IUD)     IUD without problem.   Plan:            1.  Discussed counseling and literature given refer regarding referral to [facility].  Will continue Zoloft at 200 mg.  Orders No orders of the defined types were placed in this encounter.   No orders of the defined types were placed in this encounter.     F/U  No follow-ups on file.  Elonda Husky, M.D. 06/23/2023 3:39 PM

## 2023-06-23 NOTE — Progress Notes (Signed)
Patient presents today for IUD string check. She states no longer bleeding . Patient reports no pain, discomfort or trouble with intercourse. No other questions or concerns.

## 2023-07-08 ENCOUNTER — Encounter: Payer: Self-pay | Admitting: Certified Nurse Midwife

## 2023-07-08 ENCOUNTER — Ambulatory Visit (INDEPENDENT_AMBULATORY_CARE_PROVIDER_SITE_OTHER): Payer: Medicaid Other | Admitting: Certified Nurse Midwife

## 2023-07-08 VITALS — BP 109/71 | HR 76 | Wt 193.0 lb

## 2023-07-08 DIAGNOSIS — G8929 Other chronic pain: Secondary | ICD-10-CM

## 2023-07-08 DIAGNOSIS — Z9889 Other specified postprocedural states: Secondary | ICD-10-CM

## 2023-07-08 DIAGNOSIS — Z1329 Encounter for screening for other suspected endocrine disorder: Secondary | ICD-10-CM | POA: Diagnosis not present

## 2023-07-08 DIAGNOSIS — Z131 Encounter for screening for diabetes mellitus: Secondary | ICD-10-CM | POA: Diagnosis not present

## 2023-07-08 DIAGNOSIS — F53 Postpartum depression: Secondary | ICD-10-CM

## 2023-07-08 MED ORDER — SERTRALINE HCL 100 MG PO TABS: 200.0000 mg | ORAL_TABLET | Freq: Every day | ORAL | 1 refills | Status: DC

## 2023-07-08 MED ORDER — GABAPENTIN 100 MG PO CAPS
200.0000 mg | ORAL_CAPSULE | Freq: Three times a day (TID) | ORAL | 1 refills | Status: DC
Start: 2023-07-08 — End: 2023-09-02

## 2023-07-08 MED ORDER — BUSPIRONE HCL 10 MG PO TABS: 10.0000 mg | ORAL_TABLET | Freq: Two times a day (BID) | ORAL | 1 refills | Status: DC

## 2023-07-09 LAB — CBC
Hematocrit: 36.6 % (ref 34.0–46.6)
Hemoglobin: 12.2 g/dL (ref 11.1–15.9)
MCH: 27.2 pg (ref 26.6–33.0)
MCHC: 33.3 g/dL (ref 31.5–35.7)
MCV: 82 fL (ref 79–97)
Platelets: 160 10*3/uL (ref 150–450)
RBC: 4.48 x10E6/uL (ref 3.77–5.28)
RDW: 14.5 % (ref 11.7–15.4)
WBC: 7.3 10*3/uL (ref 3.4–10.8)

## 2023-07-09 LAB — COMPREHENSIVE METABOLIC PANEL
ALT: 19 IU/L (ref 0–32)
AST: 23 IU/L (ref 0–40)
Albumin: 4.2 g/dL (ref 3.9–4.9)
Alkaline Phosphatase: 102 IU/L (ref 44–121)
BUN/Creatinine Ratio: 19 (ref 9–23)
BUN: 14 mg/dL (ref 6–20)
Bilirubin Total: 0.3 mg/dL (ref 0.0–1.2)
CO2: 28 mmol/L (ref 20–29)
Calcium: 9.5 mg/dL (ref 8.7–10.2)
Chloride: 100 mmol/L (ref 96–106)
Creatinine, Ser: 0.74 mg/dL (ref 0.57–1.00)
Globulin, Total: 2.6 g/dL (ref 1.5–4.5)
Glucose: 64 mg/dL — ABNORMAL LOW (ref 70–99)
Potassium: 4.3 mmol/L (ref 3.5–5.2)
Sodium: 140 mmol/L (ref 134–144)
Total Protein: 6.8 g/dL (ref 6.0–8.5)
eGFR: 111 mL/min/{1.73_m2} (ref >59)

## 2023-07-09 LAB — TSH+FREE T4
Free T4: 0.85 ng/dL (ref 0.82–1.77)
TSH: 2.33 u[IU]/mL (ref 0.450–4.500)

## 2023-07-09 LAB — HEMOGLOBIN A1C
Est. average glucose Bld gHb Est-mCnc: 111 mg/dL
Hgb A1c MFr Bld: 5.5 % (ref 4.8–5.6)

## 2023-07-11 ENCOUNTER — Encounter: Payer: Self-pay | Admitting: Certified Nurse Midwife

## 2023-07-11 DIAGNOSIS — M419 Scoliosis, unspecified: Secondary | ICD-10-CM | POA: Insufficient documentation

## 2023-07-11 NOTE — Progress Notes (Signed)
Elie Confer, NP   Chief Complaint  Patient presents with   Depression    HPI:      Lorraine Paul is a 32 y.o. [phone], presents today for continued depression & anxiety. She had increased zoloft to 200mg  daily but was told to decrease back to 150mg  daily. Here with her infant son, Lorraine Paul, who required a 6w NICU stay and has needed medically complex care.  Continues on methadone maintenance with plans to decrease dose, visits clinic weekly to pick up doses. Anxiety increased, mood low, did feel increased zoloft helped. She is interested in therapy.  Denies SI/HI or thoughts of self harm.   EPDS today 17    Edinburgh Postnatal Depression Scale - 07/08/23 1456       Edinburgh Postnatal Depression Scale:  In the Past 7 Days   I have been able to laugh and see the funny side of things. 2    I have looked forward with enjoyment to things. 1    I have blamed myself unnecessarily when things went wrong. 2    I have been anxious or worried for no good reason. 3    I have felt scared or panicky for no good reason. 2    Things have been getting on top of me. 2    I have been so unhappy that I have had difficulty sleeping. 2    I have felt sad or miserable. 2    I have been so unhappy that I have been crying. 1    The thought of harming myself has occurred to me. 0    Edinburgh Postnatal Depression Scale Total 17              Patient Active Problem List   Diagnosis Date Noted   Scoliosis 07/11/2023   Anti-D antibodies present during pregnancy Feb 13, 2023   Opioid use disorder, mild, in sustained remission, on maintenance therapy (HCC) 08/12/2022   Generalized anxiety disorder W/ panic attacks  08/12/2022   Panic attack 07/19/2022    Past Surgical History:  Procedure Laterality Date   CESAREAN SECTION  02/25/2023   Procedure: CESAREAN SECTION;  Surgeon: Linzie Collin, MD;  Location: [facility] ORS;  Service: Obstetrics;;   I & D EXTREMITY Left 04/08/2016    Procedure: IRRIGATION AND DEBRIDEMENT OF HAND;  Surgeon: Knute Neu, MD;  Location: [facility] OR;  Service: Plastics;  Laterality: Left;   MULTIPLE TOOTH EXTRACTIONS     9 d/t peridontal disease   OVARIAN CYST REMOVAL     TONSILLECTOMY     WISDOM TOOTH EXTRACTION     three; between 2021 and 2023    Family History  Problem Relation Age of Onset   Diabetes Maternal Grandmother    Heart disease Maternal Grandmother    Alcoholism Paternal Grandmother    Alcoholism Paternal Grandfather    Other Paternal Freight forwarder like agent orange and stuff    Social History   Socioeconomic History   Marital status: Significant Other    Spouse name: Molly Maduro   Number of children: 0   Years of education: 12   Highest education level: Not on file  Occupational History   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  Other Topics Concern   Not on file  Social History Narrative   Not on file   Social Determinants of Health   Financial Resource Strain: Medium Risk (07/14/2022)   Overall Financial Resource Strain (CARDIA)    Difficulty of Paying Living Expenses: Somewhat hard  Food Insecurity: No Food Insecurity (02/24/2023)   Hunger Vital Sign    Worried About Running Out of Food in the Last Year: Never true    Ran Out of Food in the Last Year: Never true  Transportation Needs: No Transportation Needs (02/24/2023)   PRAPARE - Administrator, Civil Service (Medical): No    Lack of Transportation (Non-Medical): No  Physical  Activity: Sufficiently Active (07/14/2022)   Exercise Vital Sign    Days of Exercise per Week: 7 days    Minutes of Exercise per Session: 40 min  Stress: No Stress Concern Present (07/14/2022)   [employer] of Occupational Health - Occupational Stress Questionnaire    Feeling of Stress : Not at all  Social Connections: Moderately Isolated (07/14/2022)   Social Connection and Isolation Panel [NHANES]    Frequency of Communication with Friends and Family: Three times a week    Frequency of Social Gatherings with Friends and Family: Not on file    Attends Religious Services: Never    Active Member of Clubs or Organizations: No    Attends Banker Meetings: Never    Marital Status: Living with partner  Intimate Partner Violence: Not At Risk (07/14/2022)   Humiliation, Afraid, Rape, and Kick questionnaire    Fear of Current or Ex-Partner: No    Emotionally Abused: No    Physically Abused: No    Sexually Abused: No    Outpatient Medications Prior to Visit  Medication Sig Dispense Refill   acetaminophen (TYLENOL) 325 MG tablet Take 1 tablet by mouth as needed.     ferrous sulfate 325 (65 FE) MG tablet Take 1 tablet (325 mg total) by mouth daily with breakfast. 30 tablet 3   ibuprofen (ADVIL) 600 MG tablet Take 1 tablet (600 mg total) by mouth every 6 (six) hours. 30 tablet 0   levonorgestrel (MIRENA) 20 MCG/DAY IUD 1 each by Intrauterine route once.     methadone (DOLOPHINE) 10 MG/ML solution Take 190 mg by mouth daily. Veriried with [facility] that current dose is 190 mg QD     Prenatal MV & Min w/FA-DHA (ONE A DAY PRENATAL) 0.4-25 MG CHEW Chew 1 capsule by mouth daily.     gabapentin (NEURONTIN) 100 MG capsule Take 2 capsules (200 mg total) by mouth 3 (three) times daily. 30 capsule 1   sertraline (ZOLOFT) 100 MG tablet Take 1.5 tablets (150 mg total) by mouth daily. 135 tablet 0   No facility-administered medications prior to visit.      ROS:  Review of  Systems  Constitutional:  Positive for fatigue.  Respiratory: Negative.    Cardiovascular: Negative.   Neurological: Negative.   Psychiatric/Behavioral:  Positive for dysphoric mood. The patient is nervous/anxious.        Irritability     OBJECTIVE:   Vitals:  BP 109/71  Pulse 76   Wt 193 lb (87.5 kg)   BMI 33.13 kg/m   Physical Exam Constitutional:      Appearance: Normal appearance.  HENT:     Head: Normocephalic.  Cardiovascular:     Rate and Rhythm: Normal rate.  Pulmonary:     Effort: Pulmonary effort is normal.  Neurological:     General: No focal deficit present.     Mental Status: She is alert and oriented to person, place, and time.  Psychiatric:        Attention and Perception: Attention normal.        Mood and Affect: Mood is depressed. Affect is flat.        Speech: Speech normal.        Behavior: Behavior normal.        Thought Content: Thought content normal.        Judgment: Judgment normal.     Results: No results found for this or any previous visit (from the past 24 hour(s)).   Assessment/Plan: Postpartum depression - Plan: CBC, Comprehensive metabolic panel, HgB A1c, TSH + free T4, CANCELED: CBC, CANCELED: Comprehensive metabolic panel, CANCELED: HgB M5H, CANCELED: TSH + free T4  Screening for thyroid disorder - Plan: TSH + free T4, CANCELED: TSH + free T4  Screening for diabetes mellitus - Plan: HgB A1c, CANCELED: HgB A1c  Postoperative state  Other chronic back pain - Plan: gabapentin (NEURONTIN) 100 MG capsule  -Increase Zoloft to 200mg , reviewed this is highest recommended therapeutic dose and may need to consider switch to different agent. She has previously been treated with antipsychotics including Seroquel. -Start Buspar for anxiety, being at 10mg  bid. -One month refill of Gabapentin (prescribed for back pain, hx of scoliosis and chronic back pain) and she is to contact orthopedics who previously prescribed this. -Open to referral  to Hillard Danker, RN as well as [facility] for therapy & medication management.   Meds ordered this encounter  Medications   sertraline (ZOLOFT) 100 MG tablet    Sig: Take 2 tablets (200 mg total) by mouth daily.    Dispense:  60 tablet    Refill:  1    Order Specific Question:   Supervising Provider    Answer:   Hildred Laser [[id number]]   busPIRone (BUSPAR) 10 MG tablet    Sig: Take 1 tablet (10 mg total) by mouth 2 (two) times daily.    Dispense:  60 tablet    Refill:  1    Order Specific Question:   Supervising Provider    Answer:   Hildred Laser [[id number]]   gabapentin (NEURONTIN) 100 MG capsule    Sig: Take 2 capsules (200 mg total) by mouth 3 (three) times daily.    Dispense:  90 capsule    Refill:  1    Order Specific Question:   Supervising Provider    Answer:   Hildred Laser [[id number]]     Dominica Severin, CNM

## 2023-07-12 ENCOUNTER — Other Ambulatory Visit: Payer: Self-pay | Admitting: Certified Nurse Midwife

## 2023-07-13 ENCOUNTER — Encounter: Payer: Self-pay | Admitting: Certified Nurse Midwife

## 2023-07-15 ENCOUNTER — Other Ambulatory Visit: Payer: Self-pay | Admitting: Certified Nurse Midwife

## 2023-07-15 MED ORDER — METRONIDAZOLE 500 MG PO TABS
500.0000 mg | ORAL_TABLET | Freq: Two times a day (BID) | ORAL | 0 refills | Status: AC
Start: 2023-07-15 — End: 2023-07-22

## 2023-07-27 IMAGING — US US OB < 14 WEEKS - US OB TV
1 series · 12 of 28 positions shown · non-contrast
Comparison: None.
COMPARISON: None.

Addendum:
CLINICAL DATA: Vaginal bleeding



[Series 1: us ob < 14 weeks - us ob tv · 12 of 105 slices shown]
[im 4/105]
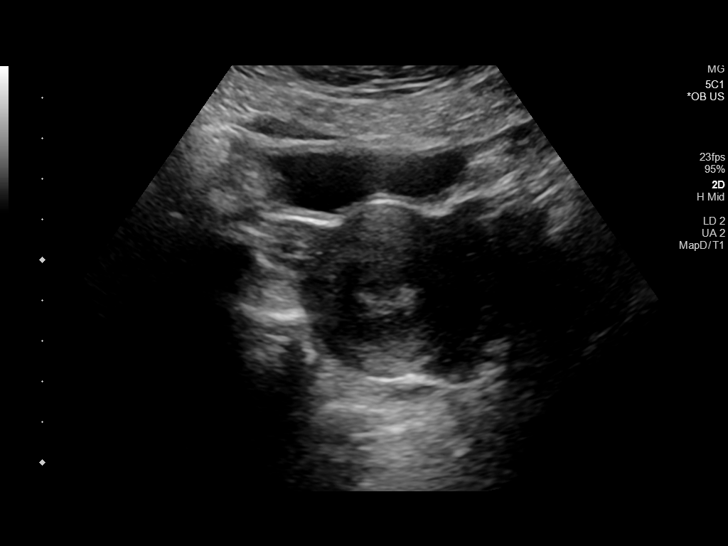
[im 12/105]
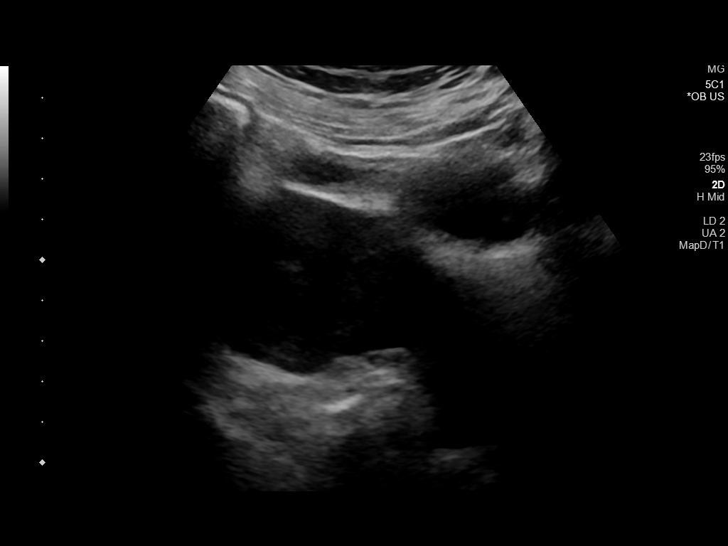
[im 20/105]
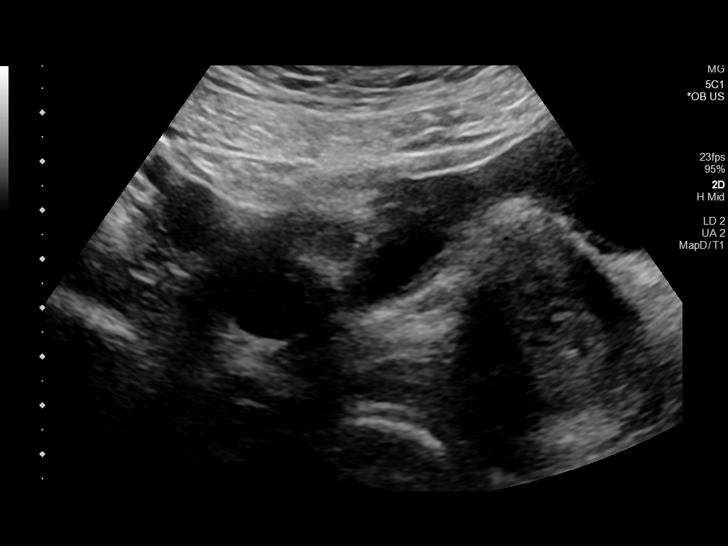
[im 31/105]
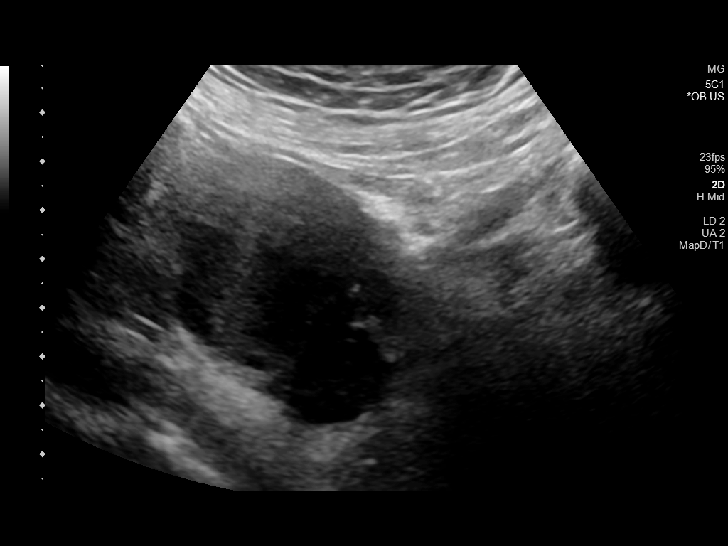
[im 39/105]
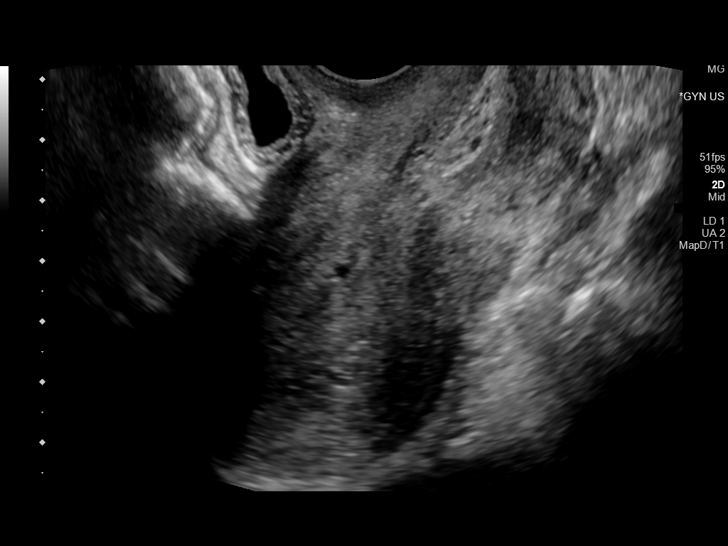
[im 47/105]
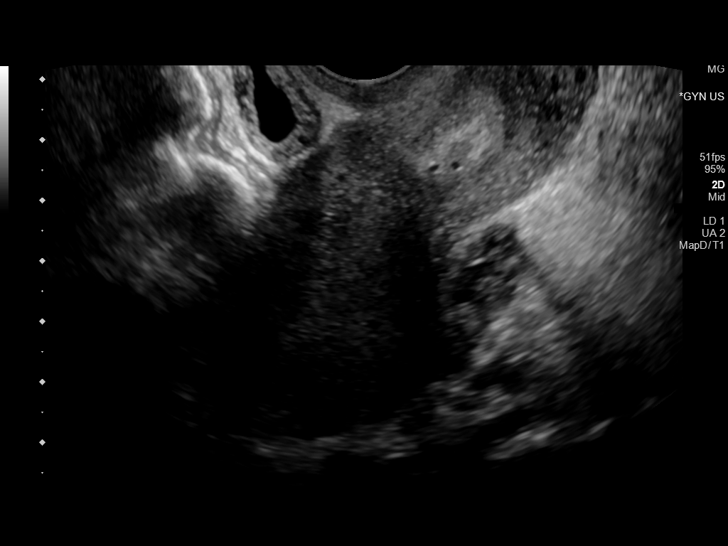
[im 58/105]
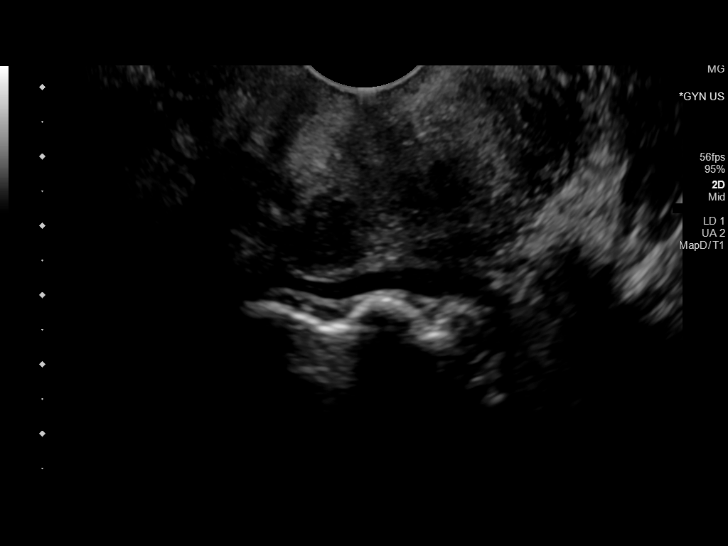
[im 66/105]
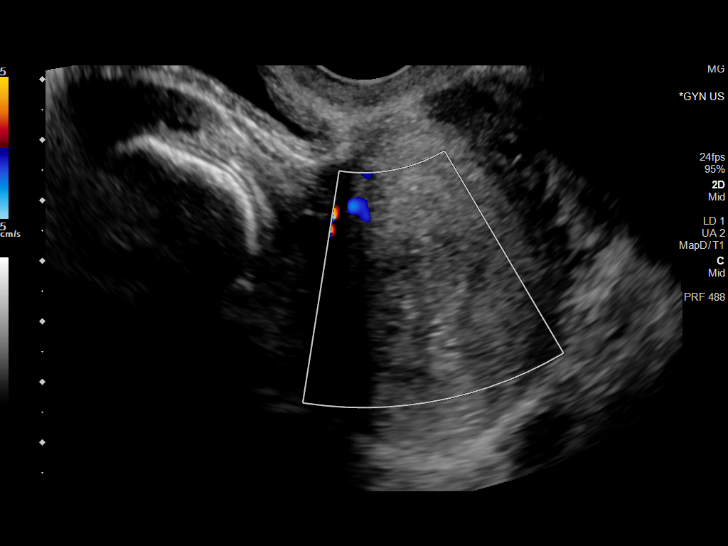
[im 74/105]
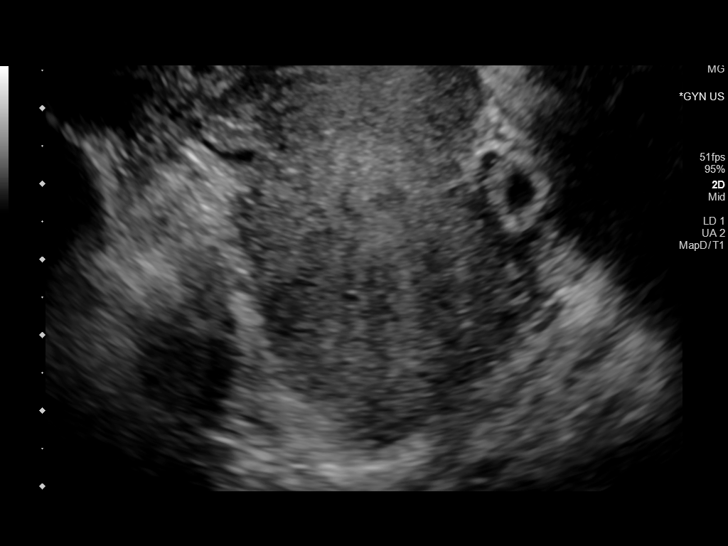
[im 85/105]
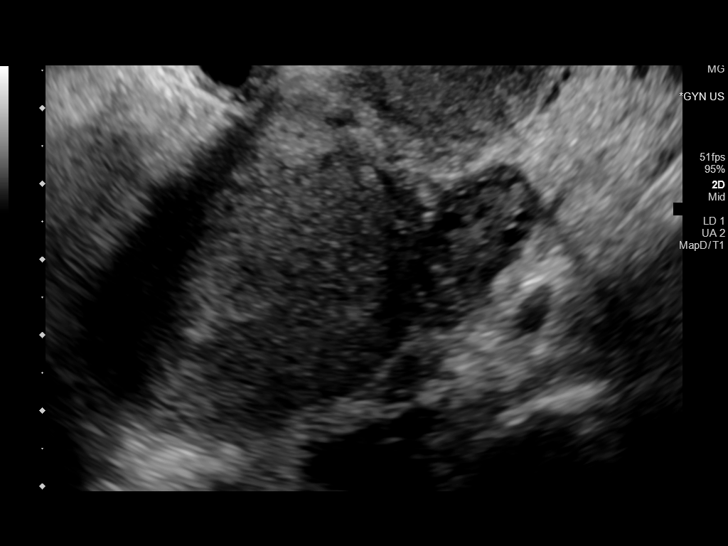
[im 93/105]
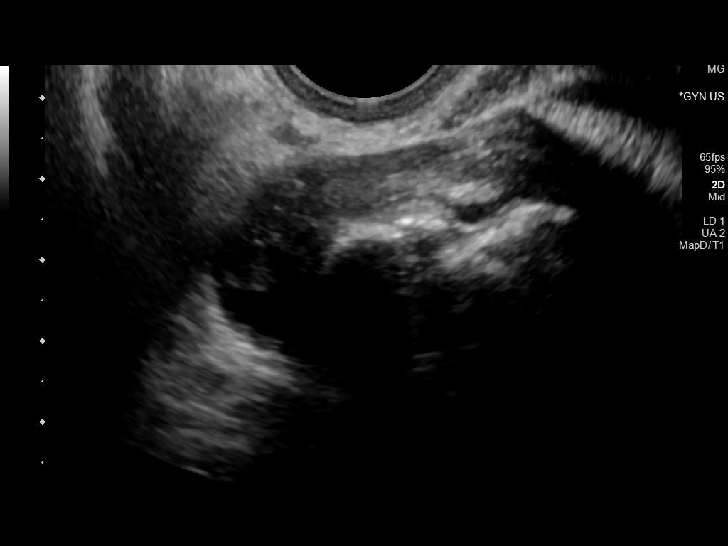
[im 101/105]
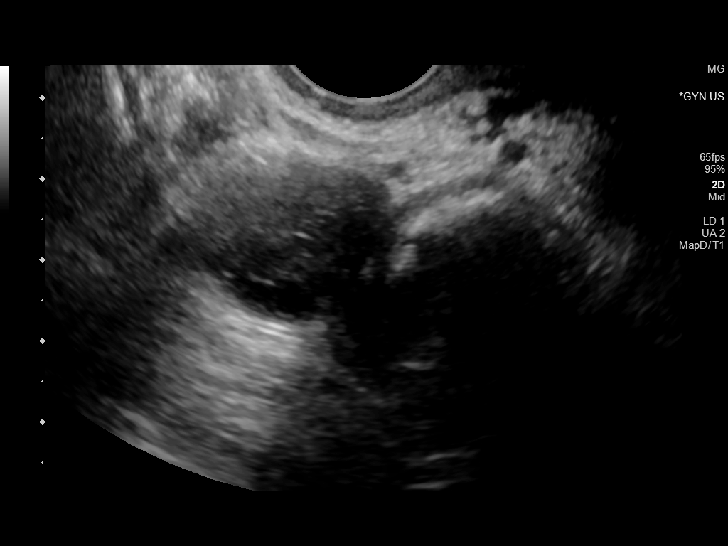

[12 of 28 positions shown; findings below may reference images not displayed]

FINDINGS: Intrauterine gestational sac: None

Yolk sac:  Not seen

Embryo:  Not seen

Cardiac Activity: Not seen

Subchorionic hemorrhage:  None visualized.

Maternal uterus/adnexae: There is inhomogeneous echogenicity in
endometrial cavity and cervical canal without significant increase
in vascularity. There are no dominant adnexal masses. There is
vascular flow in both adnexal regions. Trace amount of free fluid is
seen in the cul-de-sac.

Pulsed Doppler evaluation of both ovaries demonstrates normal
appearing low-resistance arterial and venous waveforms.
IMPRESSION: There is no demonstrable intrauterine gestational sac. If pregnancy
test is positive, differential diagnostic possibilities would
include very early IUP or failed gestation or ectopic gestation.
Serial HCG estimations and short-term follow-up study as warranted
should be considered.

Endometrial stripe is thickened measuring up to 16 mm. There is
inhomogeneous echogenicity in the endometrial cavity and cervical
canal. Possibility of blood products in the endometrial cavity
should be considered.

Trace amount of free fluid in cul-de-sac seen in the some of the
images may suggest recent rupture of ovarian follicle. No dominant
adnexal masses are seen.

ADDENDUM:
This addendum is made to clarify Doppler technique used for the
study. Color flow Doppler was performed. Pulsed Doppler examination
was not performed.

*** End of Addendum ***
FINDINGS: Intrauterine gestational sac: None

Yolk sac:  Not seen

Embryo:  Not seen

Cardiac Activity: Not seen

Subchorionic hemorrhage:  None visualized.

Maternal uterus/adnexae: There is inhomogeneous echogenicity in
endometrial cavity and cervical canal without significant increase
in vascularity. There are no dominant adnexal masses. There is
vascular flow in both adnexal regions. Trace amount of free fluid is
seen in the cul-de-sac.

Pulsed Doppler evaluation of both ovaries demonstrates normal
appearing low-resistance arterial and venous waveforms.
IMPRESSION: There is no demonstrable intrauterine gestational sac. If pregnancy
test is positive, differential diagnostic possibilities would
include very early IUP or failed gestation or ectopic gestation.
Serial HCG estimations and short-term follow-up study as warranted
should be considered.

Endometrial stripe is thickened measuring up to 16 mm. There is
inhomogeneous echogenicity in the endometrial cavity and cervical
canal. Possibility of blood products in the endometrial cavity
should be considered.

Trace amount of free fluid in cul-de-sac seen in the some of the
images may suggest recent rupture of ovarian follicle. No dominant
adnexal masses are seen.

## 2023-07-29 ENCOUNTER — Telehealth: Payer: Self-pay | Admitting: Pediatrics

## 2023-07-29 NOTE — Telephone Encounter (Signed)
NAS postpartum consult attempted without answer.  HIPAA compliant message left requesting a return call or text.  Will follow again on 10/20.

## 2023-08-04 ENCOUNTER — Other Ambulatory Visit: Payer: Self-pay | Admitting: Certified Nurse Midwife

## 2023-08-05 ENCOUNTER — Ambulatory Visit (INDEPENDENT_AMBULATORY_CARE_PROVIDER_SITE_OTHER): Payer: Medicaid Other | Admitting: Certified Nurse Midwife

## 2023-08-05 ENCOUNTER — Encounter: Payer: Self-pay | Admitting: Certified Nurse Midwife

## 2023-08-05 VITALS — BP 107/65 | HR 77 | Ht 64.0 in | Wt 193.0 lb

## 2023-08-05 DIAGNOSIS — R600 Localized edema: Secondary | ICD-10-CM | POA: Diagnosis not present

## 2023-08-05 DIAGNOSIS — F411 Generalized anxiety disorder: Secondary | ICD-10-CM

## 2023-08-05 DIAGNOSIS — F53 Postpartum depression: Secondary | ICD-10-CM

## 2023-08-05 MED ORDER — BUSPIRONE HCL 15 MG PO TABS: 15.0000 mg | ORAL_TABLET | Freq: Two times a day (BID) | ORAL | 1 refills | Status: DC

## 2023-08-05 NOTE — Progress Notes (Unsigned)
[id number]  Elie Confer, NP   Chief Complaint  Patient presents with   Follow-up    HPI:      Lorraine Paul is a 32 y.o. [phone] whose LMP was No LMP recorded.-no regular cycles Mirena in place, presents today for follow up for medication management for anxiety and depression. She reports increased stress due to moving, she moved out of the home she shared with her ex/Westyn's father and in with her mother but then moved back in to facilitate coparenting. Wednesday of this week she suffered a concussion due to the leg of the plastic chair she was sitting in breaking, she was seen in the ED and diagnosed with concussion, her head injury was closed with glue. She continues on her methadone treatment. She reports her mood has improved on Zoloft at 200mg /day but her anxiety continues despite starting Buspar 10mg  bid. She also reports pain from swelling in legs, feet will turn purple, and she will have numbness/tingling, at times skins turns red & hot.   Patient Active Problem List   Diagnosis Date Noted   Scoliosis 07/11/2023   Anti-D antibodies present during pregnancy 01/28/2023   Opioid use disorder, mild, in sustained remission, on maintenance therapy (HCC) 08/12/2022   Generalized anxiety disorder W/ panic attacks  08/12/2022   Panic attack 07/19/2022    Past Surgical History:  Procedure Laterality Date   CESAREAN SECTION  02/25/2023   Procedure: CESAREAN SECTION;  Surgeon: Linzie Collin, MD;  Location: [facility] ORS;  Service: Obstetrics;;   I & D EXTREMITY Left 04/08/2016   Procedure: IRRIGATION AND DEBRIDEMENT OF HAND;  Surgeon: Knute Neu, MD;  Location: [facility] OR;  Service: Plastics;  Laterality: Left;   MULTIPLE TOOTH EXTRACTIONS     9 d/t peridontal disease   OVARIAN CYST REMOVAL     TONSILLECTOMY     WISDOM TOOTH EXTRACTION     three; between 2021 and 2023    Family History  Problem Relation Age of Onset   Diabetes Maternal Grandmother    Heart  disease Maternal Grandmother    Alcoholism Paternal Grandmother    Alcoholism Paternal Grandfather    Other Paternal Freight forwarder like agent orange and stuff    Social History   Socioeconomic History   Marital status: Significant Other    Spouse name: Molly Maduro   Number of children: 0   Years of education: 12   Highest education level: Not on file  Occupational History   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  Other Topics Concern   Not on file  Social History Narrative   Not on file   Social Determinants of Health   Financial Resource Strain: Medium Risk (07/14/2022)   Overall Financial Resource Strain (CARDIA)    Difficulty of Paying Living Expenses: Somewhat hard  Food Insecurity: No Food Insecurity (02/24/2023)   Hunger Vital Sign  Worried About Programme researcher, broadcasting/film/video in the Last Year: Never true    Ran Out of Food in the Last Year: Never true  Transportation Needs: No Transportation Needs (02/24/2023)   PRAPARE - Administrator, Civil Service (Medical): No    Lack of Transportation (Non-Medical): No  Physical Activity: Sufficiently Active (07/14/2022)   Exercise Vital Sign    Days of Exercise per Week: 7 days    Minutes of Exercise per Session: 40 min  Stress: No Stress Concern Present (07/14/2022)   [employer] of Occupational Health - Occupational Stress Questionnaire    Feeling of Stress : Not at all  Social Connections: Moderately Isolated (07/14/2022)   Social Connection and Isolation Panel [NHANES]     Frequency of Communication with Friends and Family: Three times a week    Frequency of Social Gatherings with Friends and Family: Not on file    Attends Religious Services: Never    Active Member of Clubs or Organizations: No    Attends Banker Meetings: Never    Marital Status: Living with partner  Intimate Partner Violence: Not At Risk (07/14/2022)   Humiliation, Afraid, Rape, and Kick questionnaire    Fear of Current or Ex-Partner: No    Emotionally Abused: No    Physically Abused: No    Sexually Abused: No    Outpatient Medications Prior to Visit  Medication Sig Dispense Refill   acetaminophen (TYLENOL) 325 MG tablet Take 1 tablet by mouth as needed.     busPIRone (BUSPAR) 10 MG tablet TAKE 1 TABLET BY MOUTH TWICE A DAY 180 tablet 1   gabapentin (NEURONTIN) 100 MG capsule Take 2 capsules (200 mg total) by mouth 3 (three) times daily. 90 capsule 1   ibuprofen (ADVIL) 600 MG tablet Take 1 tablet (600 mg total) by mouth every 6 (six) hours. 30 tablet 0   levonorgestrel (MIRENA) 20 MCG/DAY IUD 1 each by Intrauterine route once.     methadone (DOLOPHINE) 10 MG/ML solution Take 190 mg by mouth daily. Veriried with [facility] that current dose is 190 mg QD     Prenatal MV & Min w/FA-DHA (ONE A DAY PRENATAL) 0.4-25 MG CHEW Chew 1 capsule by mouth daily.     sertraline (ZOLOFT) 100 MG tablet TAKE 2 TABLETS BY MOUTH EVERY DAY 180 tablet 1   ferrous sulfate 325 (65 FE) MG tablet Take 1 tablet (325 mg total) by mouth daily with breakfast. 30 tablet 3   No facility-administered medications prior to visit.      ROS:  Review of Systems  Constitutional:  Positive for fatigue.  Respiratory: Negative.    Cardiovascular: Negative.   Psychiatric/Behavioral:  Positive for dysphoric mood. The patient is nervous/anxious.      OBJECTIVE:   Vitals:  BP 107/65   Pulse 77   Ht 5\' 4"  (1.626 m)   Wt 193 lb (87.5 kg)   BMI 33.13 kg/m   Physical  Exam Constitutional:      General: She is not in acute distress.    Appearance: Normal appearance. She is well-groomed.  Cardiovascular:     Rate and Rhythm: Normal rate.  Pulmonary:     Effort: Pulmonary effort is normal.  Abdominal:     Palpations: Abdomen is soft.     Tenderness: There is no abdominal tenderness.  Musculoskeletal:     Right lower leg: 2+ Pitting Edema present.     Left lower leg: 2+ Pitting Edema present.  Neurological:     Mental Status: She is alert.  Psychiatric:        Behavior: Behavior is cooperative.     Results: No results found for this or any previous visit (from the past 24 hour(s)).   Assessment/Plan: Generalized anxiety disorder W/ panic attacks  - Plan: Ambulatory referral to Psychiatry  Postpartum depression - Plan: Ambulatory referral to Psychiatry  Bilateral leg edema - Plan: Ambulatory referral to Cardiology  Increase buspar to 15mg  bid. Recommend establishing care with psychiatry for further medication management, referral placed. Cardiology referral ordered given persistent leg swelling & hx of abnormal EKG   Meds ordered this encounter  Medications   busPIRone (BUSPAR) 15 MG tablet    Sig: Take 1 tablet (15 mg total) by mouth 2 (two) times daily.    Dispense:  60 tablet    Refill:  1    Order Specific Question:   Supervising Provider    Answer:   Hildred Laser [[id number]]     Dominica Severin, CNM

## 2023-08-06 ENCOUNTER — Encounter: Payer: Self-pay | Admitting: Certified Nurse Midwife

## 2023-08-15 ENCOUNTER — Encounter: Payer: Self-pay | Admitting: Cardiology

## 2023-08-15 ENCOUNTER — Ambulatory Visit: Payer: MEDICAID | Attending: Cardiology | Admitting: Cardiology

## 2023-08-15 VITALS — BP 120/62 | HR 82 | Ht 64.0 in | Wt 189.0 lb

## 2023-08-15 DIAGNOSIS — Z09 Encounter for follow-up examination after completed treatment for conditions other than malignant neoplasm: Secondary | ICD-10-CM

## 2023-08-15 DIAGNOSIS — R0602 Shortness of breath: Secondary | ICD-10-CM | POA: Diagnosis not present

## 2023-08-15 NOTE — Progress Notes (Signed)
Cardiology Office Note:  .   Date:  08/15/2023  ID:  Lorraine Paul, DOB [birth year], MRN [id number] PCP: Elie Confer, NP  [facility] Providers Cardiologist:  None    History of Present Illness: .   Lorraine Paul is a 32 y.o. female Discussed withthe use of AI scribe software   History of Present Illness   The patient, a 31 year old with a history of polysubstance abuse, anxiety, and panic attacks, presents with concerns of lower extremity edema and shortness of breath. She reports that the swelling is significant, with visible pitting edema since birth of boy (NICU for a while). The patient also mentions experiencing chest pain, which she attributes to stress and anxiety.  The patient recently gave birth and reports feeling overwhelmed with the responsibilities of new parenthood and managing her health. She expresses a feeling of her body "shutting down" and a general sense of weakness.  The patient has a history of an abnormal EKG with T wave abnormality in the anterior leads, but no active anginal symptoms. An echocardiogram performed a year ago showed normal pump function and valve function.  The patient has been compliant with wearing compression socks daily and elevating her legs to manage the edema. Despite these measures, she reports persistent swelling and the appearance of prominent veins in her lower extremities.  The patient's concerns are heightened by her feeling of general weakness and the recent onset of shortness of breath. She expresses worry about her health status and its potential impact on her ability to care for her newborn.        Studies Reviewed: Marland Kitchen   EKG Interpretation Date/Time:  Monday August 15 2023 09:12:50 EDT Ventricular Rate:  82 PR Interval:  130 QRS Duration:  92 QT Interval:  414 QTC Calculation: 483 R Axis:   64  Text Interpretation: Normal sinus rhythm Non-specific ST-t changes When compared with ECG of 06-Aug-2022 13:19, No  significant change since last tracing Confirmed by Donato Schultz ([id number]) on 08/15/2023 9:18:17 AM    Results LABS Albumin: Normal Creatinine: Normal  DIAGNOSTIC EKG: Chronic T wave abnormality in the anterior leads Echocardiogram: Normal pump function, normal valve function (08/18/2022) Risk Assessment/Calculations:            Physical Exam:   VS:  BP 120/62   Pulse 82   Ht 5\' 4"  (1.626 m)   Wt 189 lb (85.7 kg)   SpO2 98%   BMI 32.44 kg/m    Wt Readings from Last 3 Encounters:  08/15/23 189 lb (85.7 kg)  08/05/23 193 lb (87.5 kg)  07/08/23 193 lb (87.5 kg)    GEN: Well nourished, well developed in no acute distress NECK: No JVD; No carotid bruits CARDIAC: RRR, no murmurs, no rubs, no gallops RESPIRATORY:  Clear to auscultation without rales, wheezing or rhonchi  ABDOMEN: Soft, non-tender, non-distended EXTREMITIES:  1-2+ BLE edema; No deformity   ASSESSMENT AND PLAN: .    Assessment and Plan    Lower Extremity Edema Dependent edema noted in lower extremities. No shortness of breath or chest pain. Patient reports feeling weak and overwhelmed postpartum. Patient is already using compression stockings and elevating legs. -Order repeat echocardiogram to rule out peripartum cardiomyopathy given the patient's postpartum status and symptoms.  Anxiety/Panic Attacks Patient reports high levels of stress and anxiety, particularly postpartum and with the care of her newborn. -Referral to psychiatry was previously placed. Continue to monitor and manage as appropriate.  Chronic T wave abnormality No change in  EKG findings. No active anginal symptoms. -Continue to monitor.  Follow-up after echocardiogram results are available.              Signed, Donato Schultz, MD

## 2023-08-15 NOTE — Patient Instructions (Signed)
Medication Instructions:  The current medical regimen is effective;  continue present plan and medications.  *If you need a refill on your cardiac medications before your next appointment, please call your pharmacy*   Testing/Procedures: Your physician has requested that you have an echocardiogram. Echocardiography is a painless test that uses sound waves to create images of your heart. It provides your doctor with information about the size and shape of your heart and how well your heart's chambers and valves are working. This procedure takes approximately one hour. There are no restrictions for this procedure. Please do NOT wear cologne, perfume, aftershave, or lotions (deodorant is allowed). Please arrive 15 minutes prior to your appointment time.   Follow-Up: At [facility], you and your health needs are our priority.  As part of our continuing mission to provide you with exceptional heart care, we have created designated Provider Care Teams.  These Care Teams include your primary Cardiologist (physician) and Advanced Practice Providers (APPs -  Physician Assistants and Nurse Practitioners) who all work together to provide you with the care you need, when you need it.  We recommend signing up for the patient portal called "MyChart".  Sign up information is provided on this After Visit Summary.  MyChart is used to connect with patients for Virtual Visits (Telemedicine).  Patients are able to view lab/test results, encounter notes, upcoming appointments, etc.  Non-urgent messages can be sent to your provider as well.   To learn more about what you can do with MyChart, go to [email or web address].    Your next appointment:   Follow up as needed with Dr Anne Fu

## 2023-08-29 ENCOUNTER — Other Ambulatory Visit: Payer: Self-pay | Admitting: Certified Nurse Midwife

## 2023-09-01 ENCOUNTER — Ambulatory Visit (HOSPITAL_COMMUNITY): Payer: MEDICAID

## 2023-09-02 ENCOUNTER — Ambulatory Visit (INDEPENDENT_AMBULATORY_CARE_PROVIDER_SITE_OTHER): Payer: MEDICAID | Admitting: Certified Nurse Midwife

## 2023-09-02 ENCOUNTER — Encounter: Payer: Self-pay | Admitting: Certified Nurse Midwife

## 2023-09-02 VITALS — BP 104/60 | HR 65 | Ht 64.0 in | Wt 189.0 lb

## 2023-09-02 DIAGNOSIS — F53 Postpartum depression: Secondary | ICD-10-CM

## 2023-09-02 DIAGNOSIS — M419 Scoliosis, unspecified: Secondary | ICD-10-CM | POA: Diagnosis not present

## 2023-09-02 DIAGNOSIS — F1111 Opioid abuse, in remission: Secondary | ICD-10-CM

## 2023-09-02 DIAGNOSIS — Z30017 Encounter for initial prescription of implantable subdermal contraceptive: Secondary | ICD-10-CM

## 2023-09-02 DIAGNOSIS — G8929 Other chronic pain: Secondary | ICD-10-CM | POA: Diagnosis not present

## 2023-09-02 DIAGNOSIS — F411 Generalized anxiety disorder: Secondary | ICD-10-CM

## 2023-09-02 MED ORDER — SERTRALINE HCL 100 MG PO TABS: 200.0000 mg | ORAL_TABLET | Freq: Every day | ORAL | 1 refills | Status: DC

## 2023-09-02 MED ORDER — BUSPIRONE HCL 15 MG PO TABS: 15.0000 mg | ORAL_TABLET | Freq: Three times a day (TID) | ORAL | 1 refills | Status: DC

## 2023-09-02 MED ORDER — GABAPENTIN 100 MG PO CAPS: 200.0000 mg | ORAL_CAPSULE | Freq: Three times a day (TID) | ORAL | 2 refills | Status: DC

## 2023-09-02 MED ORDER — ETONOGESTREL 68 MG ~~LOC~~ IMPL
68.0000 mg | DRUG_IMPLANT | Freq: Once | SUBCUTANEOUS | Status: AC
  Administered 2023-09-02: 68 mg via SUBCUTANEOUS

## 2023-09-02 NOTE — Progress Notes (Unsigned)
Elie Confer, NP   Chief Complaint  Patient presents with   Follow-up    HPI:      Lorraine Paul is a 32 y.o. [phone] whose LMP was Patient's last menstrual period was 04/03/2023., presents today for postpartum depression medication management.    09/02/2023    2:32 PM 08/06/2023   11:56 AM 12/16/2022   10:33 AM 10/21/2022    9:29 AM  Depression screen PHQ 2/9  Decreased Interest 1 2 2 3   Down, Depressed, Hopeless 2 2 2 3   PHQ - 2 Score 3 4 4 6   Altered sleeping 0 2 2 3   Tired, decreased energy 3 1 3 3   Change in appetite 1 1 0 3  Feeling bad or failure about yourself  1 2 1 3   Trouble concentrating 3 3 2 3   Moving slowly or fidgety/restless 3 3 0 3  Suicidal thoughts 0 0 0 0  PHQ-9 Score 14 16 12 24   Difficult doing work/chores Somewhat difficult Somewhat difficult Somewhat difficult Extremely dIfficult      09/02/2023    2:33 PM 08/06/2023   11:57 AM 12/16/2022   10:33 AM 10/21/2022    9:29 AM  GAD 7 : Generalized Anxiety Score  Nervous, Anxious, on Edge 2 2 3 3   Control/stop worrying 2 2 3 3   Worry too much - different things 2 2 3 3   Trouble relaxing 1 2 3 3   Restless 2 2 2 3   Easily annoyed or irritable 2 2 3 3   Afraid - awful might happen 1 0 3 3  Total GAD 7 Score 12 12 20 21   Anxiety Difficulty  Somewhat difficult Somewhat difficult Extremely difficult        Patient Active Problem List   Diagnosis Date Noted   Scoliosis 07/11/2023   Anti-D antibodies present during pregnancy 2023/02/07   Opioid use disorder, mild, in sustained remission, on maintenance therapy (HCC) 08/12/2022   Generalized anxiety disorder W/ panic attacks  08/12/2022   Panic attack 07/19/2022    Past Surgical History:  Procedure Laterality Date   CESAREAN SECTION  02/25/2023   Procedure: CESAREAN SECTION;  Surgeon: Linzie Collin, MD;  Location: [facility] ORS;  Service: Obstetrics;;   I & D EXTREMITY Left 04/08/2016   Procedure: IRRIGATION AND DEBRIDEMENT OF HAND;   Surgeon: Knute Neu, MD;  Location: [facility] OR;  Service: Plastics;  Laterality: Left;   MULTIPLE TOOTH EXTRACTIONS     9 d/t peridontal disease   OVARIAN CYST REMOVAL     TONSILLECTOMY     WISDOM TOOTH EXTRACTION     three; between 2021 and 2023    Family History  Problem Relation Age of Onset   Diabetes Maternal Grandmother    Heart disease Maternal Grandmother    Alcoholism Paternal Grandmother    Alcoholism Paternal Grandfather    Other Paternal Freight forwarder like agent orange and stuff    Social History   Socioeconomic History   Marital status: Significant Other    Spouse name: Molly Maduro   Number of children: 0   Years of education: 12   Highest education level: Not on file  Occupational History   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  Other Topics Concern   Not on file  Social History Narrative   Not on file   Social Determinants of Health   Financial Resource Strain: Medium Risk (07/14/2022)   Overall Financial Resource Strain (CARDIA)    Difficulty of Paying Living Expenses: Somewhat hard  Food Insecurity: No Food Insecurity (02/24/2023)   Hunger Vital Sign    Worried About Running Out of Food in the Last Year: Never true    Ran Out of Food in the Last Year: Never true  Transportation Needs: No Transportation Needs (02/24/2023)   PRAPARE - Administrator, Civil Service (Medical): No    Lack of Transportation (Non-Medical): No  Physical Activity: Sufficiently Active (07/14/2022)   Exercise Vital Sign     Days of Exercise per Week: 7 days    Minutes of Exercise per Session: 40 min  Stress: No Stress Concern Present (07/14/2022)   [employer] of Occupational Health - Occupational Stress Questionnaire    Feeling of Stress : Not at all  Social Connections: Moderately Isolated (07/14/2022)   Social Connection and Isolation Panel [NHANES]    Frequency of Communication with Friends and Family: Three times a week    Frequency of Social Gatherings with Friends and Family: Not on file    Attends Religious Services: Never    Active Member of Clubs or Organizations: No    Attends Banker Meetings: Never    Marital Status: Living with partner  Intimate Partner Violence: Not At Risk (07/14/2022)   Humiliation, Afraid, Rape, and Kick questionnaire    Fear of Current or Ex-Partner: No    Emotionally Abused: No    Physically Abused: No    Sexually Abused: No    Outpatient Medications Prior to Visit  Medication Sig Dispense Refill   acetaminophen (TYLENOL) 325 MG tablet Take 1 tablet by mouth as needed.     ibuprofen (ADVIL) 600 MG tablet Take 1 tablet (600 mg total) by mouth every 6 (six) hours. 30 tablet 0   levonorgestrel (MIRENA) 20 MCG/DAY IUD 1 each by Intrauterine route once.     methadone (DOLOPHINE) 10 MG/ML solution Take 190 mg by mouth daily. Veriried with [facility] that current dose is 190 mg QD     busPIRone (BUSPAR) 15 MG tablet TAKE 1 TABLET BY MOUTH 2 TIMES DAILY. 180 tablet 1   gabapentin (NEURONTIN) 100 MG capsule Take 2 capsules (200 mg total) by mouth 3 (three) times daily. 90 capsule 1   sertraline (ZOLOFT) 100 MG tablet TAKE 2 TABLETS BY MOUTH EVERY DAY 180 tablet 1   Prenatal MV & Min w/FA-DHA (ONE A DAY PRENATAL) 0.4-25 MG CHEW Chew 1 capsule by mouth daily. (Patient not taking: Reported on 09/02/2023)     busPIRone (BUSPAR) 10 MG tablet TAKE 1 TABLET BY MOUTH TWICE A DAY 180 tablet 1   No facility-administered medications prior to visit.       ROS:  Review of Systems   OBJECTIVE:   Vitals:  BP 104/60   Pulse 65   Ht 5\' 4"  (1.626 m)   Wt 189 lb (85.7 kg)   LMP 04/03/2023  Breastfeeding No   BMI 32.44 kg/m   Physical Exam  Results: No results found for this or any previous visit (from the past 24 hour(s)).   Assessment/Plan: Scoliosis, unspecified scoliosis type, unspecified spinal region - Plan: Ambulatory referral to Physical Therapy  Insertion of Nexplanon - Plan: etonogestrel (NEXPLANON) implant 68 mg  Other chronic back pain - Plan: gabapentin (NEURONTIN) 100 MG capsule  Generalized anxiety disorder W/ panic attacks  - Plan: Ambulatory referral to Psychiatry  Postpartum depression - Plan: Ambulatory referral to Psychiatry  Opioid use disorder, mild, in sustained remission, on maintenance therapy (HCC) - Plan: Ambulatory referral to Psychiatry    Meds ordered this encounter  Medications   etonogestrel (NEXPLANON) implant 68 mg   busPIRone (BUSPAR) 15 MG tablet    Sig: Take 1 tablet (15 mg total) by mouth 3 (three) times daily.    Dispense:  90 tablet    Refill:  1    Order Specific Question:   Supervising Provider    Answer:   Hildred Laser [[id number]]   gabapentin (NEURONTIN) 100 MG capsule    Sig: Take 2 capsules (200 mg total) by mouth 3 (three) times daily.    Dispense:  90 capsule    Refill:  2    Order Specific Question:   Supervising Provider    Answer:   Hildred Laser [[id number]]   sertraline (ZOLOFT) 100 MG tablet    Sig: Take 2 tablets (200 mg total) by mouth daily.    Dispense:  180 tablet    Refill:  1    Order Specific Question:   Supervising Provider    Answer:   Hildred Laser [[id number]]     Dominica Severin, CNM 09/02/2023 5:03 PM

## 2023-09-06 ENCOUNTER — Telehealth: Payer: Self-pay

## 2023-09-06 NOTE — Telephone Encounter (Signed)
   Pre-operative Risk Assessment    Patient Name: Lorraine Paul  DOB: Apr 11, 1991 MRN: [id number]  Date of last visit: 08/15/23 Date of next visit: None   Request for Surgical Clearance    Procedure:  Dental Extraction - Amount of Teeth to be Pulled:  22 teeth surgically extracted; Alveoplasty  all four quadrants   Date of Surgery:  Clearance TBD                                 Surgeon:  Alberteen Spindle Surgeon's Group or Practice Name:  [facility] Phone number:  [phone] Fax number:  [phone]   Type of Clearance Requested:   - Medical    Type of Anesthesia:   Moderate sedation   Additional requests/questions:    Signed, Deerica Waszak Querida Beretta   09/06/2023, 3:21 PM

## 2023-09-06 NOTE — Telephone Encounter (Signed)
Good Afternoon Dr. Anne Fu  We have received a surgical clearance request for Ms. Reine for extraction of 22 teeth. They were seen recently in clinic on 08/15/2023. Can you please comment on surgical clearance . Please forward you guidance and recommendations to [facility] PREOP   Thanks, Robin Searing, NP

## 2023-09-07 NOTE — Telephone Encounter (Signed)
   Patient Name: Lorraine Paul  DOB: 11/06/1991 MRN: [id number]  Primary Cardiologist: None  Chart reviewed as part of pre-operative protocol coverage. Given past medical history and time since last visit, based on ACC/AHA guidelines, Bijan Garciaramirez is at acceptable risk for the planned procedure without further cardiovascular testing.    I will route this recommendation to the requesting party via Epic fax function and remove from pre-op pool.  Please call with questions.  Napoleon Form, Leodis Rains, NP 09/07/2023, 8:14 AM

## 2023-09-29 ENCOUNTER — Encounter (HOSPITAL_COMMUNITY): Payer: Self-pay

## 2023-09-29 ENCOUNTER — Ambulatory Visit (HOSPITAL_COMMUNITY): Payer: MEDICAID | Attending: Cardiology

## 2023-09-30 ENCOUNTER — Encounter (HOSPITAL_COMMUNITY): Payer: Self-pay | Admitting: Cardiology

## 2023-10-24 ENCOUNTER — Other Ambulatory Visit: Payer: Self-pay | Admitting: Obstetrics and Gynecology

## 2023-12-12 NOTE — Therapy (Deleted)
 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   Excessive weight gain during pregnancy in third trimester 02-08-23   Mental disorder    Ovarian cyst    Periodontal disease    has had 9 teeth removed   Skin infection 11/08/2014   Past Surgical History:  Procedure Laterality Date   CESAREAN SECTION  02/25/2023   Procedure: CESAREAN SECTION;  Surgeon: Janit Alm Agent, MD;  Location: [facility] ORS;  Service: Obstetrics;;   I & D EXTREMITY Left 04/08/2016   Procedure: IRRIGATION AND DEBRIDEMENT OF HAND;  Surgeon: Balinda Rogue, MD;  Location: [facility] OR;  Service: Plastics;  Laterality: Left;   MULTIPLE TOOTH EXTRACTIONS     9 d/t peridontal disease   OVARIAN CYST REMOVAL     TONSILLECTOMY     WISDOM TOOTH EXTRACTION     three; between 2021 and 2023   Patient Active Problem List   Diagnosis Date Noted   Scoliosis 07/11/2023   Anti-D antibodies present during pregnancy 2023-02-08   Opioid use disorder, mild, in sustained remission, on maintenance therapy (HCC) 08/12/2022   Generalized anxiety disorder W/ panic attacks  08/12/2022   Panic attack 07/19/2022    PCP: Cristopher Suzen HERO, NP  REFERRING PROVIDER: Jayne Harlene CROME, CNM (OBGYN)  REFERRING DIAG: scoliosis, unspecified scoliosis type, unspecified spinal region  Rationale for Evaluation and Treatment: Rehabilitation  THERAPY DIAG:  No diagnosis found.  ONSET DATE: ***  SUBJECTIVE:                                                                                                                                                                                           SUBJECTIVE STATEMENT: ***  C-section 02/25/2023  PERTINENT HISTORY:  Patient is a 33 y.o. female who presents to outpatient physical therapy with a referral for  medical diagnosis scoliosis, unspecified scoliosis type, unspecified spinal region. This patient's chief complaints consist of ***, leading to the following functional deficits: ***. Relevant past medical history and comorbidities include Opioid use disorder, mild, in sustained remission, on maintenance therapy (HCC); Generalized anxiety disorder W/ panic attacks ; Panic attack; Anti-D antibodies present during pregnancy; and Scoliosis.  has a past medical history of Circumvallate placenta (09/23/2022), Excessive weight gain during pregnancy in third trimester (Feb 08, 2023),  Ovarian cyst, Periodontal disease, and Skin infection (11/08/2014).  has a past surgical history that includes Ovarian cyst removal; I & D extremity (Left, 04/08/2016); Wisdom tooth extraction; Multiple tooth extractions; Tonsillectomy; and Cesarean section (  02/25/2023). Patient denies hx of {redflags:27294}  PAIN: Are you having pain? Yes NPRS: Current: ***/10,  Best: ***/10, Worst: ***/10. Pain location: *** Pain description: *** Aggravating factors: *** Relieving factors: ***   FUNCTIONAL LIMITATIONS: ***  LEISURE: ***  PRECAUTIONS: {Therapy precautions:24002}  WEIGHT BEARING RESTRICTIONS: {Yes ***/No:24003}  FALLS:  Has patient fallen in last 6 months? {fallsyesno:27318}  LIVING ENVIRONMENT: Lives with: {[facility] lives with:25569::lives with their family} Lives in: {Lives in:25570} Stairs: {opstairs:27293} Has following equipment at home: {Assistive devices:23999}  OCCUPATION: ***  PLOF: {PLOF:24004}  PATIENT GOALS: ***  NEXT MD VISIT: ***  OBJECTIVE  DIAGNOSTIC FINDINGS:  None recent found in chart review  SELF-REPORTED FUNCTION Modified Oswestry Disability Index (mODI): ***% (range 0-100%)  OBJECTIVE  SELF-REPORTED FUNCTION  Modified Oswestry Disability Index (ODI) Pain Intensity {ODI Question 1:32146} Personal Care (Washing, Dressing, etc.) {ODI question 2:32147} Lifting {ODI question  3:32148} Walking {ODI question 4:32149} Sitting {ODI question 5:32150} Standing {ODI question 6:32151} Sleeping {ODI question 7:32152} Employment/Homemaking {mODI question 8:32230} Social Life {ODI question 9:32154} Traveling {ODI question 10:32155}  Scoring: Each question is scored on a 0-5 scale, with the total possible score ranging from 0 to 50. To calculate the percentage disability, the total score is multiplied by 2 (or divide by 50 and multiply by 100).  0-20%: Minimal disability 21-40%: Moderate disability 41-60%: Severe disability 61-80%: Crippled 81-100%: Bedridden or exaggerating symptoms  MCID: 12%    OBSERVATION/INSPECTION Posture Posture (seated): forward head, rounded shoulders, slumped in sitting.  Posture (standing): *** Posture correction: *** Anthropometrics Tremor: none Body composition: *** Muscle bulk: *** Skin: The incision sites appear to be healing well with no excessive redness, warmth, drainage or signs of infection present.  *** Edema: *** Functional Mobility Bed mobility: *** Transfers: *** Gait: grossly WFL for household and short community ambulation. More detailed gait analysis deferred to later date as needed. *** Stairs: ***  SPINE MOTION  LUMBAR SPINE AROM *Indicates pain Flexion: *** Extension: *** Side Flexion:   R ***  L *** Rotation:  R *** L *** Side glide:  R *** L ***    NEUROLOGICAL  Upper Motor Neuron Screen Babinski, Hoffman's and Clonus (ankle) negative bilaterally.  Dermatomes C2-T1 appears equal and intact to light touch except the following: *** L2-S2 appears equal and intact to light touch except the following: *** Deep Tendon Reflexes R/L  ***+/***+ Biceps brachii reflex (C5, C6) ***+/***+ Brachioradialis reflex (C6) ***+/***+ Triceps brachii reflex (C7) ***+/***+ Quadriceps reflex (L4) ***+/***+ Achilles reflex (S1)  SPINE MOTION  CERVICAL SPINE AROM *Indicates pain Flexion:  *** Extension: *** Side Flexion:   R ***  L *** Rotation:  R *** L *** Protraction: *** Retrusion: ***   PERIPHERAL JOINT MOTION (in degrees)  ACTIVE RANGE OF MOTION (AROM) *Indicates pain Date Date Date  Joint/Motion R/L R/L R/L  Shoulder     Flexion / / /  Extension / / /  Abduction  / / /  External rotation / / /  Internal rotation / / /  Elbow     Flexion  / / /  Extension  / / /  Wrist     Flexion / / /  Extension  / / /  Radial deviation / / /  Ulnar deviation / / /  Pronation / / /  Supination / / /  Hip     Flexion / / /  Extension  / / /  Abduction / / /  Adduction / / /  External rotation / / /  Internal rotation  / / /  Knee     Extension / / /  Flexoin / / /  Ankle/Foot     Dorsiflexion (knee ext) / / /  Dorsiflexion (knee flex) / / /  Plantarflexion / / /  Everison / / /  Inversion / / /  Great toe extension / / /  Great toe flexion / / /  Comments:   PASSIVE RANGE OF MOTION (PROM) *Indicates pain Date Date Date  Joint/Motion R/L R/L R/L  Shoulder     Flexion / / /  Extension / / /  Abduction  / / /  External rotation / / /  Internal rotation / / /  Elbow     Flexion  / / /  Extension  / / /  Wrist     Flexion / / /  Extension  / / /  Radial deviation / / /  Ulnar deviation / / /  Pronation / / /  Supination / / /  Hip     Flexion  / / /  Extension  / / /  Abduction / / /  Adduction / / /  External rotation / / /  Internal rotation  / / /  Knee     Extension / / /  Flexion / / /  Ankle/Foot     Dorsiflexion (knee ext) / / /  Dorsiflexion (knee flex) / / /  Plantarflexion / / /  Everison / / /  Inversion / / /  Great toe extension / / /  Great toe flexion / / /  Comments:   MUSCLE PERFORMANCE (MMT):  *Indicates pain Date Date Date  Joint/Motion R/L R/L R/L  Shoulder     Flexion / / /  Abduction (C5) / / /  External rotation / / /  Internal rotation / / /  Extension / / /  Elbow     Flexion (C6) / / /   Extension (C7) / / /  Wrist     Flexion (C7) / / /  Extension (C6) / / /  Radial deviation / / /  Ulnar deviation (C8) / / /  Pronation / / /  Supination / / /  Hand     Thumb extension (C8) / / /  Finger abduction (T1) / / /  Grip (C8) / / /  Hip     Flexion (L1, L2) / / /  Extension (knee ext) / / /  Extension (knee flex) / / /  Abduction / / /  Adduction / / /  External rotation / / /  Internal rotation  / / /  Knee     Extension (L3) / / /  Flexion (S2) / / /  Ankle/Foot     Dorsiflexion (L4) / / /  Great toe extension (L5) / / /  Eversion (S1) / / /  Plantarflexion (S1) / / /  Inversion / / /  Pronation / / /  Great toe flexion / / /  Comments:   SPECIAL TESTS:  .Neurodynamictests .NeurodynamicUE .NeurodynamicLE .CspineInstability .CSPINESPECIALTESTS .SHOULDERSPECIALTESTCLUSTERS .HIPSPECIALTESTS .SIJSPECIALTESTS   SHOULDER SPECIAL TESTS RTC, Impingement, Anterior Instability (macrotrauma), Labral Tear: Painful arc test: R = ***, L = ***. Drop arm test: R = ***, L = ***. Hawkins-Kennedy test: R = ***, L = ***. Infraspinatus test: R = ***, L = ***. Apprehension test: R = ***, L = ***.  Relocation test: R = ***, L = ***. Active compression test: R = ***, L = ***.  ACCESSORY MOTION: ***  PALPATION: ***  SUSTAINED POSITIONS TESTING:  ***  REPEATED MOTIONS TESTING: ***  FUNCTIONAL/BALANCE TESTS: Five Time Sit to Stand (5TSTS): *** seconds Functional Gait Assessment (FGA): ***/30 (see details above) Ten meter walking trial ( ): *** m/s Six Minute Walk Test ( ): *** feet Timed Up and Go (TUG): *** seconds   Dynamic Gait Index: ***/24 BERG Balance Scale: ***/56 Tinetti/POMA: ***/28 Timed Up and GO: *** seconds (average of 3 trials) Trial 1: *** Trial 2: *** Trial 3: *** Romberg test: -Narrow stance, eyes open: *** seconds -Narrow stance, eyes closed: *** seconds Sharpened Romberg test: -Tandem stance, eyes open: ***  seconds -Tandem stance, eyes closed: *** seconds  Narrow stance, firm surface, eyes open: *** seconds Narrow stance, firm surface, eyes closed: *** seconds Narrow stance, compliant surface, eyes open: *** seconds Narrow stance, compliant surface, eyes closed: *** seconds Single leg stance, firm surface, eyes open: R= *** seconds, L= *** seconds Single leg stance, compliant surface, eyes open: R= *** seconds, L= *** seconds Gait speed: *** m/s Functional reach test: *** inches       TREATMENT                                                                                                                        PATIENT EDUCATION:  Education details: *** Person educated: {Person educated:25204} Education method: {Education Method:25205} Education comprehension: {Education Comprehension:25206}  HOME EXERCISE PROGRAM: ***  ASSESSMENT:  CLINICAL IMPRESSION: Patient is a 33 y.o. female referred to outpatient physical therapy with a medical diagnosis of scoliosis, unspecified scoliosis type, unspecified spinal region who presents with signs and symptoms consistent with ***. Patient presents with significant *** impairments that are limiting ability to complete *** without difficulty. Patient will benefit from skilled physical therapy intervention to address current body structure impairments and activity limitations to improve function and work towards goals set in current POC in order to return to prior level of function or maximal functional improvement.   OBJECTIVE IMPAIRMENTS: {opptimpairments:25111}.   ACTIVITY LIMITATIONS: {activitylimitations:27494}  PARTICIPATION LIMITATIONS: {participationrestrictions:25113}  PERSONAL FACTORS: {Personal factors:25162} are also affecting patient's functional outcome.   REHAB POTENTIAL: {rehabpotential:25112}  CLINICAL DECISION MAKING: {clinical decision making:25114}  EVALUATION COMPLEXITY: {Evaluation  complexity:25115}   GOALS: Goals reviewed with patient? {yes/no:20286}  SHORT TERM GOALS: Target date: 12/26/2023  Patient will be independent with initial home exercise program for self-management of symptoms. Baseline: {HEPbaseline4:27310} (12/12/23); Goal status: INITIAL  Patient will demonstrate improvement in Patient Specific Functional Scale (PSFS) of equal or greater than 3 points to reflect clinically significant improvement in patient's most valued functional activities.. Baseline: {Sarasgoalbaseline:32234} (12/12/23); Goal status: INITIAL  2. Patient will demonstrate improvement in Modified Oswestry Disability Index (mODI) by equal or greater than 12 percentage points (MCID) to reflect clinically significant improvement in overall condition and self-reported functional ability. Baseline: {Sarasgoalbaseline:32234} (12/12/23); Goal status: INITIAL  3.  Patient will report improvement in NPRS of equal or greater than 2 points during functional activities to improve their abilitly to complete community, work and/or recreational activities with less limitation. Baseline: ***/10 (12/12/23); Goal status: INITIAL   LONG TERM GOALS: Target date: 03/05/2024  Patient will be independent with a long-term home exercise program for self-management of symptoms.  Baseline: {HEPbaseline4:27310} (12/12/23); Goal status: INITIAL  2.  Patient will demonstrate improved Modified Oswestry Disability Index (mODI) to equal or less than 10% to demonstrate improvement in overall condition and self-reported functional ability.  Baseline: {Sarasgoalbaseline:32234} (12/12/23); Goal status: INITIAL  3.  *** Baseline: {Sarasgoalbaseline:32234} (12/12/23); Goal status: INITIAL  4.  *** Baseline: {Sarasgoalbaseline:32234} (12/12/23); Goal status: INITIAL  5.  Patient will demonstrate improvement in Patient Specific Functional Scale (PSFS) of equal or greater than 8/10 points to reflect clinically  significant improvement in patient's most valued functional activities. Baseline: {Sarasgoalbaseline:32234} (12/12/23); Goal status: INITIAL  5.  Patient will report NPRS equal or less than 3/10 during functional activities during the last 2 weeks to improve their abilitly to complete community, work and/or recreational activities with less limitation. Baseline: ***/10 (12/12/23); Goal status: INITIAL   PLAN:  PT FREQUENCY: {rehab frequency:25116}  PT DURATION: {rehab duration:25117}  PLANNED INTERVENTIONS: {rehab planned interventions:25118::97110-Therapeutic exercises,97530- Therapeutic [phone]- Neuromuscular re-education,97535- Self Rjmz,02859- Manual therapy}.  PLAN FOR NEXT SESSION: ***   Camie SAUNDERS. Juli, PT, DPT 12/12/23, 6:00 PM  [facility] [address] [place], [place] [address] P: [phone] I F: [phone]

## 2023-12-13 ENCOUNTER — Ambulatory Visit: Payer: MEDICAID | Admitting: Physical Therapy

## 2023-12-15 ENCOUNTER — Ambulatory Visit: Payer: Self-pay | Admitting: Physical Therapy

## 2023-12-20 ENCOUNTER — Ambulatory Visit: Payer: Self-pay | Admitting: Physical Therapy

## 2023-12-22 ENCOUNTER — Ambulatory Visit: Payer: Self-pay | Admitting: Physical Therapy

## 2023-12-27 ENCOUNTER — Encounter: Payer: Self-pay | Admitting: Physical Therapy

## 2023-12-29 ENCOUNTER — Encounter: Payer: Self-pay | Admitting: Physical Therapy

## 2023-12-29 NOTE — Therapy (Deleted)
 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   Excessive weight gain during pregnancy in third trimester Jan 28, 2023   Mental disorder    Ovarian cyst    Periodontal disease    has had 9 teeth removed   Skin infection 11/08/2014   Past Surgical History:  Procedure Laterality Date   CESAREAN SECTION  02/25/2023   Procedure: CESAREAN SECTION;  Surgeon: Linzie Collin, MD;  Location: [facility] ORS;  Service: Obstetrics;;   I & D EXTREMITY Left 04/08/2016   Procedure: IRRIGATION AND DEBRIDEMENT OF HAND;  Surgeon: Knute Neu, MD;  Location: [facility] OR;  Service: Plastics;  Laterality: Left;   MULTIPLE TOOTH EXTRACTIONS     9 d/t peridontal disease   OVARIAN CYST REMOVAL     TONSILLECTOMY     WISDOM TOOTH EXTRACTION     three; between 2021 and 2023   Patient Active Problem List   Diagnosis Date Noted   Scoliosis 07/11/2023   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  ONSET DATE: ***  SUBJECTIVE:                                                                                                                                                                                           SUBJECTIVE STATEMENT: ***  C-section 02/25/2023  PERTINENT HISTORY:  Patient is a 33 y.o. female who presents to outpatient physical therapy with a referral for  medical diagnosis scoliosis, unspecified scoliosis type, unspecified spinal region. This patient's chief complaints consist of ***, leading to the following functional deficits: ***. Relevant past medical history and comorbidities include Opioid use disorder, mild, in sustained remission, on maintenance therapy (HCC); Generalized anxiety disorder W/ panic attacks ; Panic attack; Anti-D antibodies present during pregnancy; and Scoliosis.  has a past medical history of Circumvallate placenta (09/23/2022), Excessive weight gain during pregnancy in third trimester (01/28/2023),  Ovarian cyst, Periodontal disease, and Skin infection (11/08/2014).  has a past surgical history that includes Ovarian cyst removal; I & D extremity (Left, 04/08/2016); Wisdom tooth extraction; Multiple tooth extractions; Tonsillectomy; and Cesarean section (  02/25/2023). Patient denies hx of {redflags:27294}  PAIN: Are you having pain? Yes NPRS: Current: ***/10,  Best: ***/10, Worst: ***/10. Pain location: *** Pain description: *** Aggravating factors: *** Relieving factors: ***   FUNCTIONAL LIMITATIONS: ***  LEISURE: ***  PRECAUTIONS: {Therapy precautions:24002}  WEIGHT BEARING RESTRICTIONS: {Yes ***/No:24003}  FALLS:  Has patient fallen in last 6 months? {fallsyesno:27318}  LIVING ENVIRONMENT: Lives with: {[facility] lives with:25569::"lives with their family"} Lives in: {Lives in:25570} Stairs: {opstairs:27293} Has following equipment at home: {Assistive devices:23999}  OCCUPATION: ***  PLOF: {PLOF:24004}  PATIENT GOALS: ***  NEXT MD VISIT: ***  OBJECTIVE  DIAGNOSTIC FINDINGS:  None recent found in chart review  SELF-REPORTED FUNCTION Modified Oswestry Disability Index (mODI): ***% (range 0-100%)  OBJECTIVE  SELF-REPORTED FUNCTION  Modified Oswestry Disability Index (ODI) Pain Intensity {ODI Question 1:32146} Personal Care (Washing, Dressing, etc.) {ODI question 2:32147} Lifting {ODI question  3:32148} Walking {ODI question 4:32149} Sitting {ODI question 5:32150} Standing {ODI question 6:32151} Sleeping {ODI question 7:32152} Employment/Homemaking {mODI question 8:32230} Social Life {ODI question 9:32154} Traveling {ODI question 10:32155}  Scoring: Each question is scored on a 0-5 scale, with the total possible score ranging from 0 to 50. To calculate the percentage disability, the total score is multiplied by 2 (or divide by 50 and multiply by 100).  0-20%: Minimal disability 21-40%: Moderate disability 41-60%: Severe disability 61-80%: Crippled 81-100%: Bedridden or exaggerating symptoms  MCID: 12%    OBSERVATION/INSPECTION Posture Posture (seated): forward head, rounded shoulders, slumped in sitting.  Posture (standing): *** Posture correction: *** Anthropometrics Tremor: none Body composition: *** Muscle bulk: *** Skin: The incision sites appear to be healing well with no excessive redness, warmth, drainage or signs of infection present.  *** Edema: *** Functional Mobility Bed mobility: *** Transfers: *** Gait: grossly WFL for household and short community ambulation. More detailed gait analysis deferred to later date as needed. *** Stairs: ***  SPINE MOTION  LUMBAR SPINE AROM *Indicates pain Flexion: *** Extension: *** Side Flexion:   R ***  L *** Rotation:  R *** L *** Side glide:  R *** L ***    NEUROLOGICAL  Upper Motor Neuron Screen Babinski, Hoffman's and Clonus (ankle) negative bilaterally.  Dermatomes C2-T1 appears equal and intact to light touch except the following: *** L2-S2 appears equal and intact to light touch except the following: *** Deep Tendon Reflexes R/L  ***+/***+ Biceps brachii reflex (C5, C6) ***+/***+ Brachioradialis reflex (C6) ***+/***+ Triceps brachii reflex (C7) ***+/***+ Quadriceps reflex (L4) ***+/***+ Achilles reflex (S1)  SPINE MOTION  CERVICAL SPINE AROM *Indicates pain Flexion:  *** Extension: *** Side Flexion:   R ***  L *** Rotation:  R *** L *** Protraction: *** Retrusion: ***   PERIPHERAL JOINT MOTION (in degrees)  ACTIVE RANGE OF MOTION (AROM) *Indicates pain Date Date Date  Joint/Motion R/L R/L R/L  Shoulder     Flexion / / /  Extension / / /  Abduction  / / /  External rotation / / /  Internal rotation / / /  Elbow     Flexion  / / /  Extension  / / /  Wrist     Flexion / / /  Extension  / / /  Radial deviation / / /  Ulnar deviation / / /  Pronation / / /  Supination / / /  Hip     Flexion / / /  Extension  / / /  Abduction / / /  Adduction / / /  External rotation / / /  Internal rotation  / / /  Knee     Extension / / /  Flexoin / / /  Ankle/Foot     Dorsiflexion (knee ext) / / /  Dorsiflexion (knee flex) / / /  Plantarflexion / / /  Everison / / /  Inversion / / /  Great toe extension / / /  Great toe flexion / / /  Comments:   PASSIVE RANGE OF MOTION (PROM) *Indicates pain Date Date Date  Joint/Motion R/L R/L R/L  Shoulder     Flexion / / /  Extension / / /  Abduction  / / /  External rotation / / /  Internal rotation / / /  Elbow     Flexion  / / /  Extension  / / /  Wrist     Flexion / / /  Extension  / / /  Radial deviation / / /  Ulnar deviation / / /  Pronation / / /  Supination / / /  Hip     Flexion  / / /  Extension  / / /  Abduction / / /  Adduction / / /  External rotation / / /  Internal rotation  / / /  Knee     Extension / / /  Flexion / / /  Ankle/Foot     Dorsiflexion (knee ext) / / /  Dorsiflexion (knee flex) / / /  Plantarflexion / / /  Everison / / /  Inversion / / /  Great toe extension / / /  Great toe flexion / / /  Comments:   MUSCLE PERFORMANCE (MMT):  *Indicates pain Date Date Date  Joint/Motion R/L R/L R/L  Shoulder     Flexion / / /  Abduction (C5) / / /  External rotation / / /  Internal rotation / / /  Extension / / /  Elbow     Flexion (C6) / / /   Extension (C7) / / /  Wrist     Flexion (C7) / / /  Extension (C6) / / /  Radial deviation / / /  Ulnar deviation (C8) / / /  Pronation / / /  Supination / / /  Hand     Thumb extension (C8) / / /  Finger abduction (T1) / / /  Grip (C8) / / /  Hip     Flexion (L1, L2) / / /  Extension (knee ext) / / /  Extension (knee flex) / / /  Abduction / / /  Adduction / / /  External rotation / / /  Internal rotation  / / /  Knee     Extension (L3) / / /  Flexion (S2) / / /  Ankle/Foot     Dorsiflexion (L4) / / /  Great toe extension (L5) / / /  Eversion (S1) / / /  Plantarflexion (S1) / / /  Inversion / / /  Pronation / / /  Great toe flexion / / /  Comments:   SPECIAL TESTS:  .Neurodynamictests .NeurodynamicUE .NeurodynamicLE .CspineInstability .CSPINESPECIALTESTS .SHOULDERSPECIALTESTCLUSTERS .HIPSPECIALTESTS .SIJSPECIALTESTS   SHOULDER SPECIAL TESTS RTC, Impingement, Anterior Instability (macrotrauma), Labral Tear: Painful arc test: R = ***, L = ***. Drop arm test: R = ***, L = ***. Hawkins-Kennedy test: R = ***, L = ***. Infraspinatus test: R = ***, L = ***. Apprehension test: R = ***, L = ***.  Relocation test: R = ***, L = ***. Active compression test: R = ***, L = ***.  ACCESSORY MOTION: ***  PALPATION: ***  SUSTAINED POSITIONS TESTING:  ***  REPEATED MOTIONS TESTING: ***  FUNCTIONAL/BALANCE TESTS: Five Time Sit to Stand (5TSTS): *** seconds Functional Gait Assessment (FGA): ***/30 (see details above) Ten meter walking trial ( ): *** m/s Six Minute Walk Test ( ): *** feet Timed Up and Go (TUG): *** seconds   Dynamic Gait Index: ***/24 BERG Balance Scale: ***/56 Tinetti/POMA: ***/28 Timed Up and GO: *** seconds (average of 3 trials) Trial 1: *** Trial 2: *** Trial 3: *** Romberg test: -Narrow stance, eyes open: *** seconds -Narrow stance, eyes closed: *** seconds Sharpened Romberg test: -Tandem stance, eyes open: ***  seconds -Tandem stance, eyes closed: *** seconds  Narrow stance, firm surface, eyes open: *** seconds Narrow stance, firm surface, eyes closed: *** seconds Narrow stance, compliant surface, eyes open: *** seconds Narrow stance, compliant surface, eyes closed: *** seconds Single leg stance, firm surface, eyes open: R= *** seconds, L= *** seconds Single leg stance, compliant surface, eyes open: R= *** seconds, L= *** seconds Gait speed: *** m/s Functional reach test: *** inches       TREATMENT                                                                                                                        PATIENT EDUCATION:  Education details: *** Person educated: {Person educated:25204} Education method: {Education Method:25205} Education comprehension: {Education Comprehension:25206}  HOME EXERCISE PROGRAM: ***  ASSESSMENT:  CLINICAL IMPRESSION: Patient is a 33 y.o. female referred to outpatient physical therapy with a medical diagnosis of scoliosis, unspecified scoliosis type, unspecified spinal region who presents with signs and symptoms consistent with ***. Patient presents with significant *** impairments that are limiting ability to complete *** without difficulty. Patient will benefit from skilled physical therapy intervention to address current body structure impairments and activity limitations to improve function and work towards goals set in current POC in order to return to prior level of function or maximal functional improvement.   OBJECTIVE IMPAIRMENTS: {opptimpairments:25111}.   ACTIVITY LIMITATIONS: {activitylimitations:27494}  PARTICIPATION LIMITATIONS: {participationrestrictions:25113}  PERSONAL FACTORS: {Personal factors:25162} are also affecting patient's functional outcome.   REHAB POTENTIAL: {rehabpotential:25112}  CLINICAL DECISION MAKING: {clinical decision making:25114}  EVALUATION COMPLEXITY: {Evaluation  complexity:25115}   GOALS: Goals reviewed with patient? {yes/no:20286}  SHORT TERM GOALS: Target date: 01/12/2024  Patient will be independent with initial home exercise program for self-management of symptoms. Baseline: {HEPbaseline4:27310} (12/29/23); Goal status: INITIAL  Patient will demonstrate improvement in Patient Specific Functional Scale (PSFS) of equal or greater than 3 points to reflect clinically significant improvement in patient's most valued functional activities.. Baseline: {Sarasgoalbaseline:32234} (12/29/23); Goal status: INITIAL  2. Patient will demonstrate improvement in Modified Oswestry Disability Index (mODI) by equal or greater than 12 percentage points (MCID) to reflect clinically significant improvement in overall condition and self-reported functional ability. Baseline: {Sarasgoalbaseline:32234} (12/29/23); Goal status: INITIAL  3.  Patient will report improvement in NPRS of equal or greater than 2 points during functional activities to improve their abilitly to complete community, work and/or recreational activities with less limitation. Baseline: ***/10 (12/29/23); Goal status: INITIAL   LONG TERM GOALS: Target date: 03/22/2024  Patient will be independent with a long-term home exercise program for self-management of symptoms.  Baseline: {HEPbaseline4:27310} (12/29/23); Goal status: INITIAL  2.  Patient will demonstrate improved Modified Oswestry Disability Index (mODI) to equal or less than 10% to demonstrate improvement in overall condition and self-reported functional ability.  Baseline: {Sarasgoalbaseline:32234} (12/29/23); Goal status: INITIAL  3.  *** Baseline: {Sarasgoalbaseline:32234} (12/29/23); Goal status: INITIAL  4.  *** Baseline: {Sarasgoalbaseline:32234} (12/29/23); Goal status: INITIAL  5.  Patient will demonstrate improvement in Patient Specific Functional Scale (PSFS) of equal or greater than 8/10 points to reflect clinically  significant improvement in patient's most valued functional activities. Baseline: {Sarasgoalbaseline:32234} (12/29/23); Goal status: INITIAL  5.  Patient will report NPRS equal or less than 3/10 during functional activities during the last 2 weeks to improve their abilitly to complete community, work and/or recreational activities with less limitation. Baseline: ***/10 (12/29/23); Goal status: INITIAL   PLAN:  PT FREQUENCY: {rehab frequency:25116}  PT DURATION: {rehab duration:25117}  PLANNED INTERVENTIONS: {rehab planned interventions:25118::"97110-Therapeutic exercises","97530- Therapeutic [phone]- Neuromuscular re-education","97535- Self JXBJ","47829- Manual therapy"}.  PLAN FOR NEXT SESSION: ***   Luretha Murphy. Ilsa Iha, PT, DPT 12/29/23, 7:24 PM  [facility] [address] [place], [place] [address] P: [phone] I F: [phone]

## 2024-01-03 ENCOUNTER — Encounter: Payer: Self-pay | Admitting: Physical Therapy

## 2024-01-05 ENCOUNTER — Encounter: Payer: Self-pay | Admitting: Physical Therapy

## 2024-01-10 ENCOUNTER — Encounter: Payer: Self-pay | Admitting: Physical Therapy

## 2024-01-11 ENCOUNTER — Ambulatory Visit: Payer: MEDICAID | Attending: Certified Nurse Midwife | Admitting: Physical Therapy

## 2024-01-12 ENCOUNTER — Ambulatory Visit: Payer: MEDICAID | Admitting: Physical Therapy

## 2024-01-16 ENCOUNTER — Ambulatory Visit: Payer: MEDICAID | Admitting: Physical Therapy

## 2024-01-18 ENCOUNTER — Ambulatory Visit: Payer: MEDICAID | Admitting: Physical Therapy

## 2024-01-23 ENCOUNTER — Encounter: Payer: Self-pay | Admitting: Physical Therapy

## 2024-01-25 ENCOUNTER — Encounter: Payer: Self-pay | Admitting: Physical Therapy

## 2024-01-31 ENCOUNTER — Encounter: Payer: Self-pay | Admitting: Physical Therapy

## 2024-02-02 ENCOUNTER — Encounter: Payer: Self-pay | Admitting: Physical Therapy

## 2024-03-03 ENCOUNTER — Other Ambulatory Visit: Payer: Self-pay | Admitting: Certified Nurse Midwife

## 2024-03-08 ENCOUNTER — Other Ambulatory Visit: Payer: Self-pay | Admitting: Obstetrics and Gynecology

## 2024-03-08 ENCOUNTER — Other Ambulatory Visit: Payer: Self-pay | Admitting: Certified Nurse Midwife

## 2024-03-16 ENCOUNTER — Other Ambulatory Visit: Payer: Self-pay

## 2024-03-16 DIAGNOSIS — B9689 Other specified bacterial agents as the cause of diseases classified elsewhere: Secondary | ICD-10-CM

## 2024-03-16 DIAGNOSIS — B379 Candidiasis, unspecified: Secondary | ICD-10-CM

## 2024-03-16 MED ORDER — FLUCONAZOLE 150 MG PO TABS: 150.0000 mg | ORAL_TABLET | Freq: Every day | ORAL | 0 refills | Status: DC

## 2024-03-16 MED ORDER — METRONIDAZOLE 500 MG PO TABS: 500.0000 mg | ORAL_TABLET | Freq: Two times a day (BID) | ORAL | 0 refills | Status: DC

## 2024-03-16 NOTE — Progress Notes (Signed)
 Patient calling in with BV symptoms, states she is positive this is what it is due to her history. Rx sent but patient has been made aware next time she will need to come in to the office, she is understanding.

## 2024-04-18 ENCOUNTER — Ambulatory Visit: Payer: MEDICAID | Admitting: Certified Nurse Midwife

## 2024-04-18 ENCOUNTER — Ambulatory Visit: Payer: MEDICAID

## 2024-04-18 NOTE — Progress Notes (Deleted)
    NURSE VISIT NOTE  Subjective:    Patient ID: Meria Crilly, female    DOB: 1990/11/27, 33 y.o.   MRN: [id number]  HPI  Patient is a 33 y.o. G51P1021 female who presents for evaluation of amenorrhea. She believes she could be pregnant. {pregnancy desire:14500::Pregnancy is desired.} Sexual Activity: {sexual partners:705}. Current symptoms also include: {preg sx:18128}. Last period was {norm/abn:16337}.    Objective:    There were no vitals taken for this visit.  Lab Review  No results found for any visits on 04/18/24.  Assessment:   No diagnosis found.  Plan:   {AOB + PREGNANCY PLAN:28596:p}  {AOB - PREGNANCY PLAN:28597:p}   Inga Manges, CMA

## 2024-05-17 ENCOUNTER — Ambulatory Visit: Payer: MEDICAID | Admitting: Obstetrics and Gynecology

## 2024-05-17 ENCOUNTER — Encounter: Payer: Self-pay | Admitting: Obstetrics and Gynecology

## 2024-05-17 VITALS — BP 109/58 | HR 71 | Ht 64.0 in | Wt 185.0 lb

## 2024-05-17 DIAGNOSIS — N921 Excessive and frequent menstruation with irregular cycle: Secondary | ICD-10-CM

## 2024-05-17 DIAGNOSIS — N643 Galactorrhea not associated with childbirth: Secondary | ICD-10-CM

## 2024-05-17 DIAGNOSIS — N898 Other specified noninflammatory disorders of vagina: Secondary | ICD-10-CM

## 2024-05-17 DIAGNOSIS — N6311 Unspecified lump in the right breast, upper outer quadrant: Secondary | ICD-10-CM

## 2024-05-17 DIAGNOSIS — Z3202 Encounter for pregnancy test, result negative: Secondary | ICD-10-CM

## 2024-05-17 DIAGNOSIS — Z30431 Encounter for routine checking of intrauterine contraceptive device: Secondary | ICD-10-CM

## 2024-05-17 DIAGNOSIS — N644 Mastodynia: Secondary | ICD-10-CM | POA: Diagnosis not present

## 2024-05-17 LAB — POCT WET PREP WITH KOH
Clue Cells Wet Prep HPF POC: NEGATIVE
KOH Prep POC: NEGATIVE
Trichomonas, UA: NEGATIVE
Yeast Wet Prep HPF POC: NEGATIVE

## 2024-05-17 LAB — POCT URINE PREGNANCY: Preg Test, Ur: NEGATIVE

## 2024-05-17 NOTE — Patient Instructions (Signed)
 I value your feedback and you entrusting [place] with your care. If you get a [place] patient survey, I would appreciate you taking the time to let us know about your experience today. Thank you! ? ? ?

## 2024-05-17 NOTE — Progress Notes (Signed)
 Cristopher Suzen HERO, NP   Chief Complaint  Patient presents with   Breast Exam    Lump on RB, has noticed milk come out, did not breastfeed (had baby on 02/25/23)   Vaginal Discharge    Heavy and thick discharge no itching or odor x months   UPT    Pt requesting UPT, says she knows she has IUD and she has gained weight making her look like she's pregnant.    HPI:      [name] is a 33 y.o. [id number] whose LMP was Patient's last menstrual period was 04/22/2024 (approximate)., presents today for RT breast tenderness UOQ starting 2 wks ago, more painful past few days. She and fiancee felt a mass. Tenderness not related to menses. Strong FH breast cancer on mat side. Has also had bilat breast galactorrhea intermittently/randomly for the past 4 months, pt will note bra is wet. No recent BF. No hx of breast masses/complaints/no prior breast imaging.  On methadone  for 4 yrs.   Mirena  IUD placed 05/26/23; has irregular/frequent menses, lasting up to 2 wks, heavy flow for most of it. Bleeding worse with IUD than not on BC. She is sexually active, no pain/bleeding/dryness. No new partners.   Having increased vag d/c for a few months, no itching/odor. Hx of BV in past, felt like getting monthly. Treats with flagyl  with sx relief until sx recur. Using baby soap, dryer sheets, doesn't usually wear underwear. Not taking probiotics.   Neg pap 9/23  Patient Active Problem List   Diagnosis Date Noted   Scoliosis 07/11/2023   Anti-D antibodies present during pregnancy Jan 27, 2023   Opioid use disorder, mild, in sustained remission, on maintenance therapy (HCC) 08/12/2022   Generalized anxiety disorder W/ panic attacks  08/12/2022   Panic attack 07/19/2022    Past Surgical History:  Procedure Laterality Date   CESAREAN SECTION  02/25/2023   Procedure: CESAREAN SECTION;  Surgeon: Janit Alm Agent, MD;  Location: [facility] ORS;  Service: Obstetrics;;   I & D EXTREMITY Left 04/08/2016    Procedure: IRRIGATION AND DEBRIDEMENT OF HAND;  Surgeon: Balinda Rogue, MD;  Location: [facility] OR;  Service: Plastics;  Laterality: Left;   MULTIPLE TOOTH EXTRACTIONS     9 d/t peridontal disease   OVARIAN CYST REMOVAL     TONSILLECTOMY     WISDOM TOOTH EXTRACTION     three; between 2021 and 2023    Family History  Problem Relation Age of Onset   Breast cancer Mother 33   Breast cancer Maternal Grandmother        late years   Diabetes Maternal Grandmother    Heart disease Maternal Grandmother    Alcoholism Paternal Grandmother    Alcoholism Paternal Grandfather    Other Paternal Freight forwarder like agent orange and stuff   Breast cancer Maternal Great-grandmother        30s    Social History   Socioeconomic History   Marital status: Significant Other    Spouse name: Lamar   Number of children: 0   Years of education: 12   Highest education level: Not on file  Occupational History   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    Comment: Mirena   Other Topics Concern   Not on file  Social History Narrative   Not on file   Social Drivers of Health   Financial Resource Strain: Medium Risk (01/30/2024)   [facility]   Overall Financial Resource Strain (CARDIA)    Difficulty of Paying Living Expenses: Somewhat hard  Food Insecurity: Food Insecurity Present (01/30/2024)   Received from [facility]   Hunger Vital Sign    Within the past 12 months, you worried that your food would run out before you got the  money to buy more.: Often true    Within the past 12 months, the food you bought just didn't last and you didn't have money to get more.: Sometimes true  Transportation Needs: No Transportation Needs (01/30/2024)   Received from [facility] - Transportation    Lack of Transportation (Medical): No    Lack of Transportation (Non-Medical): No  Physical Activity: Insufficiently Active (01/30/2024)   Received from [facility]   Exercise Vital Sign    On average, how many days per week do you engage in moderate to strenuous exercise (like a brisk walk)?: 4 days    On average, how many minutes do you engage in exercise at this level?: 30 min  Stress: Stress Concern Present (01/30/2024)   Received from [facility] - Occupational Stress Questionnaire    Feeling of Stress : Very much  Social Connections: Socially Integrated (01/30/2024)   Received from [facility]   Social Network    How would you rate your social network (family, work, friends)?: Good participation with social networks  Intimate Partner Violence: Not At Risk (01/30/2024)   Received from [facility]   HITS    Over the last 12 months how often did your partner physically hurt you?: Never    Over the last 12 months how often did your partner insult you or talk down to you?: Never    Over the last 12 months how often did your partner threaten you with physical harm?: Never    Over the last 12 months how often did your partner scream or curse at you?: Never    Outpatient Medications Prior to Visit  Medication Sig Dispense Refill   acetaminophen  (TYLENOL ) 325 MG tablet Take 1 tablet by mouth as needed.     ALPRAZolam  (XANAX ) 1 MG tablet Take 1 mg by mouth 3 (three) times daily as needed.     gabapentin  (NEURONTIN ) 100 MG capsule Take 2 capsules (200 mg total) by mouth 3 (three) times daily. 90 capsule 2   levonorgestrel  (MIRENA ) 20 MCG/DAY IUD 1 each by Intrauterine route  once.     methadone  (DOLOPHINE ) 10 MG/ML solution Take 190 mg by mouth daily. Veriried with [facility] that current dose is 190 mg QD     sertraline  (ZOLOFT ) 100 MG tablet Take 2 tablets (200 mg total) by mouth daily. 180 tablet 1   busPIRone  (BUSPAR ) 15 MG tablet TAKE 1 TABLET BY MOUTH TWICE A DAY 180 tablet 1   chlorhexidine (PERIDEX) 0.12 % solution SWISH FOR 1 MINUTE AND EXPECTORATE TWICE A  DAY FOR 10 DAYS     Prenatal MV & Min w/FA-DHA (ONE A DAY PRENATAL) 0.4-25 MG CHEW Chew 1 capsule by mouth daily. (Patient not taking: Reported on 09/02/2023)     No facility-administered medications prior to visit.      ROS:  Review of Systems  Constitutional:  Negative for fever.  Gastrointestinal:  Negative for blood in stool, constipation, diarrhea, nausea and vomiting.  Genitourinary:  Positive for menstrual problem and vaginal discharge. Negative for dyspareunia, dysuria, flank pain, frequency, hematuria, urgency, vaginal bleeding and vaginal pain.  Musculoskeletal:  Negative for back pain.  Skin:  Negative for rash.   BREAST: tenderness/mass   OBJECTIVE:   Vitals:  BP (!) 109/58   Pulse 71   Ht 5' 4 (1.626 m)   Wt 185 lb (83.9 kg)   LMP 04/22/2024 (Approximate)   Breastfeeding No   BMI 31.76 kg/m   Physical Exam Vitals reviewed.  Constitutional:      Appearance: She is well-developed.  Pulmonary:     Effort: Pulmonary effort is normal.  Chest:  Breasts:    Breasts are symmetrical.     Right: Tenderness present. No inverted nipple, mass, nipple discharge or skin change.     Left: No inverted nipple, mass, nipple discharge, skin change or tenderness.    Genitourinary:    General: Normal vulva.     Pubic Area: No rash.      Labia:        Right: No rash, tenderness or lesion.        Left: No rash, tenderness or lesion.      Vagina: Normal. No vaginal discharge, erythema or tenderness.     Cervix: Normal.     Uterus: Normal. Not enlarged and not tender.       Adnexa: Right adnexa normal and left adnexa normal.       Right: No mass or tenderness.         Left: No mass or tenderness.       Comments: IUD STRINGS IN CX OS Musculoskeletal:        General: Normal range of motion.     Cervical back: Normal range of motion.  Skin:    General: Skin is warm and dry.  Neurological:     General: No focal deficit present.     Mental Status: She is alert and oriented to person, place, and time.     Cranial Nerves: No cranial nerve deficit.  Psychiatric:        Mood and Affect: Mood normal.        Behavior: Behavior normal.        Thought Content: Thought content normal.        Judgment: Judgment normal.     Results: Results for orders placed or performed in visit on 05/17/24 (from the past 24 hours)  POCT urine pregnancy     Status: Normal   Collection Time: 05/17/24 10:11 AM  Result Value Ref Range   Preg Test, Ur Negative Negative  POCT Wet Prep with KOH     Status: Normal   Collection Time: 05/17/24 12:10 PM  Result Value Ref Range   Trichomonas, UA Negative    Clue Cells Wet Prep HPF POC neg    Epithelial Wet Prep HPF POC     Yeast Wet Prep HPF POC neg    Bacteria Wet Prep HPF POC     RBC Wet Prep HPF POC     WBC Wet Prep  HPF POC     KOH Prep POC Negative Negative     Assessment/Plan: Galactorrhea - Plan: POCT urine pregnancy, TSH + free T4, Prolactin; neg UPT, check labs. Neg exam today. Pt on methadone  which can cause galactorrhea.   Mass of upper outer quadrant of right breast - Plan: US  LIMITED ULTRASOUND INCLUDING AXILLA RIGHT BREAST, US  LIMITED ULTRASOUND INCLUDING AXILLA LEFT BREAST , MM 3D DIAGNOSTIC MAMMOGRAM BILATERAL BREAST, pt to schedule mammo. Neg exam except tenderness.   Breast pain, right - Plan: US  LIMITED ULTRASOUND INCLUDING AXILLA RIGHT BREAST, US  LIMITED ULTRASOUND INCLUDING AXILLA LEFT BREAST , MM 3D DIAGNOSTIC MAMMOGRAM BILATERAL BREAST, neg UPT, pt to schedule mammo.   Vaginal discharge - Plan: POCT Wet  Prep with KOH, NuSwab Vaginitis (VG); neg wet prep, check culture. Will f/u if abn. If neg, reassurance. Neg d/c on exam today. Hx of BV--dove sens skin soap, line dry underwear, add probiotics, condoms/withdrawal.   Encounter for routine checking of intrauterine contraceptive device (IUD) - Plan: US  PELVIS TRANSVAGINAL NON-OB (TV ONLY)  Menorrhagia with irregular cycle - Plan: US  PELVIS TRANSVAGINAL NON-OB (TV ONLY); check labs and GYN u/s. Will f/u with results. If prolactin elevated, then that is most likely cause of sx.     Return in about 2 days (around 05/19/2024) for GYN u/s for menorrhagia/IUD check--[facility] to call with results.  Oluwatobiloba Martin B. Kyani Simkin, PA-C 05/17/2024 12:11 PM

## 2024-05-18 LAB — PROLACTIN: Prolactin: 5.5 ng/mL (ref 4.8–33.4)

## 2024-05-18 LAB — TSH+FREE T4
Free T4: 0.97 ng/dL (ref 0.82–1.77)
TSH: 2.49 u[IU]/mL (ref 0.450–4.500)

## 2024-05-20 ENCOUNTER — Ambulatory Visit: Payer: Self-pay | Admitting: Obstetrics and Gynecology

## 2024-05-20 LAB — NUSWAB VAGINITIS (VG)
Candida albicans, NAA: NEGATIVE
Candida glabrata, NAA: NEGATIVE
Trich vag by NAA: NEGATIVE

## 2024-05-23 ENCOUNTER — Ambulatory Visit
Admission: RE | Admit: 2024-05-23 | Discharge: 2024-05-23 | Disposition: A | Discharge status: Home / Self Care | Payer: MEDICAID | Source: Ambulatory Visit | Attending: Obstetrics and Gynecology | Admitting: Obstetrics and Gynecology

## 2024-05-23 ENCOUNTER — Inpatient Hospital Stay
Admission: RE | Admit: 2024-05-23 | Discharge: 2024-05-23 | Discharge status: Home / Self Care | Payer: MEDICAID | Source: Ambulatory Visit | Attending: Obstetrics and Gynecology

## 2024-05-23 DIAGNOSIS — N6311 Unspecified lump in the right breast, upper outer quadrant: Secondary | ICD-10-CM | POA: Insufficient documentation

## 2024-05-23 DIAGNOSIS — N644 Mastodynia: Secondary | ICD-10-CM

## 2024-05-30 ENCOUNTER — Other Ambulatory Visit: Payer: MEDICAID

## 2024-06-02 ENCOUNTER — Other Ambulatory Visit: Payer: Self-pay | Admitting: Certified Nurse Midwife

## 2024-06-07 ENCOUNTER — Telehealth: Payer: Self-pay

## 2024-06-07 ENCOUNTER — Other Ambulatory Visit: Payer: Self-pay | Admitting: Certified Nurse Midwife

## 2024-06-07 DIAGNOSIS — M5489 Other dorsalgia: Secondary | ICD-10-CM

## 2024-06-07 MED ORDER — SERTRALINE HCL 100 MG PO TABS: 200.0000 mg | ORAL_TABLET | Freq: Every day | ORAL | 0 refills | Status: DC

## 2024-06-07 MED ORDER — GABAPENTIN 100 MG PO CAPS: 200.0000 mg | ORAL_CAPSULE | Freq: Three times a day (TID) | ORAL | 0 refills | Status: DC

## 2024-06-07 NOTE — Progress Notes (Signed)
 One month refill zoloft  & gabapentin  provided, needs annual exam prior to any further refills.

## 2024-06-07 NOTE — Telephone Encounter (Signed)
 Patient called. She is completely out of Gabapentin  and Zoloft . Can not get her refills. I see were you refused her prescriptions. She said she only sees you and her Psychiatrist, she does not have a PCP. Complains that she can not just stop using these medications. Please advise.

## 2024-06-19 ENCOUNTER — Other Ambulatory Visit: Payer: MEDICAID

## 2024-07-08 ENCOUNTER — Other Ambulatory Visit: Payer: Self-pay | Admitting: Certified Nurse Midwife

## 2024-07-08 DIAGNOSIS — G8929 Other chronic pain: Secondary | ICD-10-CM

## 2024-07-09 ENCOUNTER — Other Ambulatory Visit: Payer: Self-pay | Admitting: Certified Nurse Midwife

## 2024-07-11 ENCOUNTER — Encounter: Payer: Self-pay | Admitting: Certified Nurse Midwife

## 2024-07-11 ENCOUNTER — Ambulatory Visit (INDEPENDENT_AMBULATORY_CARE_PROVIDER_SITE_OTHER): Payer: MEDICAID | Admitting: Certified Nurse Midwife

## 2024-07-11 ENCOUNTER — Other Ambulatory Visit (HOSPITAL_COMMUNITY)
Admission: RE | Admit: 2024-07-11 | Discharge: 2024-07-11 | Disposition: A | Discharge status: Home / Self Care | Payer: MEDICAID | Source: Ambulatory Visit | Attending: Certified Nurse Midwife | Admitting: Certified Nurse Midwife

## 2024-07-11 VITALS — BP 111/73 | HR 72 | Ht 64.0 in | Wt 172.4 lb

## 2024-07-11 DIAGNOSIS — R102 Pelvic and perineal pain: Secondary | ICD-10-CM | POA: Diagnosis not present

## 2024-07-11 DIAGNOSIS — Z113 Encounter for screening for infections with a predominantly sexual mode of transmission: Secondary | ICD-10-CM | POA: Insufficient documentation

## 2024-07-11 DIAGNOSIS — R300 Dysuria: Secondary | ICD-10-CM | POA: Diagnosis not present

## 2024-07-11 DIAGNOSIS — F411 Generalized anxiety disorder: Secondary | ICD-10-CM | POA: Diagnosis not present

## 2024-07-11 LAB — POCT URINALYSIS DIPSTICK
Bilirubin, UA: NEGATIVE
Blood, UA: NEGATIVE
Glucose, UA: NEGATIVE
Ketones, UA: NEGATIVE
Leukocytes, UA: NEGATIVE
Nitrite, UA: NEGATIVE
Protein, UA: NEGATIVE
Spec Grav, UA: 1.025 (ref 1.010–1.025)
Urobilinogen, UA: 0.2 U/dL
pH, UA: 6 (ref 5.0–8.0)

## 2024-07-12 ENCOUNTER — Encounter: Payer: Self-pay | Admitting: Certified Nurse Midwife

## 2024-07-12 ENCOUNTER — Ambulatory Visit: Payer: Self-pay | Admitting: Certified Nurse Midwife

## 2024-07-12 LAB — URINALYSIS, ROUTINE W REFLEX MICROSCOPIC
Bilirubin, UA: NEGATIVE
Glucose, UA: NEGATIVE
Ketones, UA: NEGATIVE
Nitrite, UA: NEGATIVE
RBC, UA: NEGATIVE
Specific Gravity, UA: >=1.03 — AB (ref 1.005–1.030)
Urobilinogen, Ur: 1 mg/dL (ref 0.2–1.0)
pH, UA: 6 (ref 5.0–7.5)

## 2024-07-12 LAB — CERVICOVAGINAL ANCILLARY ONLY
Bacterial Vaginitis (gardnerella): POSITIVE — AB
Candida Glabrata: POSITIVE — AB
Candida Vaginitis: POSITIVE — AB
Chlamydia: NEGATIVE
Comment: NEGATIVE
Comment: NEGATIVE
Comment: NEGATIVE
Comment: NEGATIVE
Comment: NEGATIVE
Comment: NORMAL
Neisseria Gonorrhea: NEGATIVE
Trichomonas: NEGATIVE

## 2024-07-12 LAB — MICROSCOPIC EXAMINATION
Casts: NONE SEEN /LPF
Epithelial Cells (non renal): >10 /HPF — AB (ref 0–10)

## 2024-07-12 MED ORDER — FLUCONAZOLE 150 MG PO TABS
150.0000 mg | ORAL_TABLET | ORAL | 0 refills | Status: AC
Start: 2024-07-12 — End: 2024-07-16

## 2024-07-12 MED ORDER — METRONIDAZOLE 500 MG PO TABS
500.0000 mg | ORAL_TABLET | Freq: Two times a day (BID) | ORAL | 0 refills | Status: AC
Start: 2024-07-12 — End: 2024-07-19

## 2024-07-12 NOTE — Progress Notes (Signed)
 Cristopher Suzen HERO, NP   Chief Complaint  Patient presents with   Wound Check    C-section check    Abdominal Pain   Dysuria    HPI:      Lorraine Paul is a 33 y.o. [id number] whose LMP was Patient's last menstrual period was 05/08/2024 (within weeks)., presents today for irregular bleeding-last cycle lasted 2.5w; sharp random abdominal & back pain, centers around C/S incision site. Also with pain on urination. Concerned for IUD moving or infection.  Has seen psychiatrist (unable to establish with regularly due to cost, must pay out of pocket) and has started prn Xanax  with good relief. Desires to continue Zoloft  & Gabapentin  at current doses as her pain is overall managed and mood stable.  Mother recently diagnosed with stage 4 lung cancer with metastases, she is supporting her mother as well as continuing to manage Weston's complex medical needs. Stable in recovery, continues on methadone  without problems.  Period Pattern: (!) Irregular Menstrual Flow: Heavy Menstrual Control: Thin pad, Maxi pad Menstrual Control Change Freq (Hours): .5-1 Dysmenorrhea: (!) Severe Dysmenorrhea Symptoms: Cramping, Headache   Patient Active Problem List   Diagnosis Date Noted   Scoliosis 07/11/2023   Anti-D antibodies present during pregnancy Feb 16, 2023   Opioid use disorder, mild, in sustained remission, on maintenance therapy (HCC) 08/12/2022   Generalized anxiety disorder W/ panic attacks  08/12/2022   Panic attack 07/19/2022    Past Surgical History:  Procedure Laterality Date   CESAREAN SECTION  02/25/2023   Procedure: CESAREAN SECTION;  Surgeon: Janit Alm Agent, MD;  Location: [facility] ORS;  Service: Obstetrics;;   I & D EXTREMITY Left 04/08/2016   Procedure: IRRIGATION AND DEBRIDEMENT OF HAND;  Surgeon: Balinda Rogue, MD;  Location: [facility] OR;  Service: Plastics;  Laterality: Left;   MULTIPLE TOOTH EXTRACTIONS     9 d/t peridontal disease   OVARIAN CYST REMOVAL     TONSILLECTOMY      WISDOM TOOTH EXTRACTION     three; between 2021 and 2023    Family History  Problem Relation Age of Onset   Breast cancer Mother 54   Breast cancer Maternal Grandmother 65       late years   Diabetes Maternal Grandmother    Heart disease Maternal Grandmother    Alcoholism Paternal Grandmother    Alcoholism Paternal Grandfather    Other Paternal Freight forwarder like agent orange and stuff   Breast cancer Maternal Great-grandmother        30s    Social History   Socioeconomic History   Marital status: Significant Other    Spouse name: Lamar   Number of children: 0   Years of education: 12   Highest education level: Not on file  Occupational History   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    Comment: Mirena   Other Topics Concern   Not on  file  Social History Narrative   Not on file   Social Drivers of Health   Financial Resource Strain: Medium Risk (01/30/2024)   Received from [facility]   Overall Financial Resource Strain (CARDIA)    Difficulty of Paying Living Expenses: Somewhat hard  Food Insecurity: Food Insecurity Present (01/30/2024)   Received from [facility]   Hunger Vital Sign    Within the past 12 months, you worried that your food would run out before you got the money to buy more.: Often true    Within the past 12 months, the food you bought just didn't last and you didn't have money to get more.: Sometimes true  Transportation Needs: No Transportation Needs (01/30/2024)   Received from [facility] - Transportation    Lack of Transportation (Medical): No    Lack of Transportation (Non-Medical): No  Physical Activity: Insufficiently Active (01/30/2024)   Received from [facility]   Exercise Vital Sign    On average, how many days per week do you engage in moderate to strenuous exercise (like a brisk walk)?: 4 days    On average, how many minutes do you engage in exercise at this level?: 30 min  Stress: Stress Concern Present (01/30/2024)   Received from [facility] of [facility] - Occupational Stress Questionnaire    Feeling of Stress : Very much  Social Connections: Socially Integrated (01/30/2024)   Received from [facility]   Social Network    How would you rate your social network (family, work, friends)?: Good participation with social networks  Intimate Partner Violence: Not At Risk (01/30/2024)   Received from [facility]   HITS    Over the last 12 months how often did your partner physically hurt you?: Never    Over the last 12 months how often did your partner insult you or talk down to you?: Never    Over the last 12 months how often did your partner threaten you with physical harm?: Never    Over the last 12 months how often did your partner scream or curse at you?: Never    Outpatient Medications Prior to Visit  Medication Sig Dispense Refill   acetaminophen  (TYLENOL ) 325 MG tablet Take 1 tablet by mouth as needed.     ALPRAZolam  (XANAX ) 1 MG tablet Take 1 mg by mouth 3 (three) times daily as needed.     gabapentin  (NEURONTIN ) 100 MG capsule TAKE 2 CAPSULES BY MOUTH 3 TIMES DAILY. 180 capsule 0   levonorgestrel  (MIRENA ) 20 MCG/DAY IUD 1 each by Intrauterine route once.     methadone  (DOLOPHINE ) 10 MG/ML solution Take 190 mg by mouth daily. Veriried with [facility] that current dose is 190 mg QD     sertraline  (ZOLOFT ) 100 MG tablet TAKE 2 TABLETS BY MOUTH EVERY DAY 60 tablet 0   No  facility-administered medications prior to visit.       07/11/2024   11:55 AM 09/02/2023    2:32 PM 08/06/2023   11:56 AM 12/16/2022   10:33 AM 10/21/2022    9:29 AM  Depression screen PHQ 2/9  Decreased Interest 2 1 2 2 3   Down, Depressed, Hopeless 1 2 2 2 3   PHQ - 2 Score 3 3 4 4 6   Altered sleeping 3 0 2 2 3   Tired, decreased energy 2 3 1 3 3   Change in appetite 3 1 1  0 3  Feeling bad or failure  about yourself  1 1 2 1 3   Trouble concentrating 0 3 3 2 3   Moving slowly or fidgety/restless 3 3 3  0 3  Suicidal thoughts 0 0 0 0 0  PHQ-9 Score 15 14 16 12 24   Difficult doing work/chores Somewhat difficult Somewhat difficult Somewhat difficult Somewhat difficult Extremely dIfficult      07/11/2024   11:56 AM 09/02/2023    2:33 PM 08/06/2023   11:57 AM 12/16/2022   10:33 AM  GAD 7 : Generalized Anxiety Score  Nervous, Anxious, on Edge 2 2 2 3   Control/stop worrying 2 2 2 3   Worry too much - different things 2 2 2 3   Trouble relaxing 3 1 2 3   Restless 2 2 2 2   Easily annoyed or irritable 1 2 2 3   Afraid - awful might happen 2 1 0 3  Total GAD 7 Score 14 12 12 20   Anxiety Difficulty Somewhat difficult  Somewhat difficult Somewhat difficult     ROS:  Review of Systems  Constitutional: Negative.   Respiratory: Negative.    Cardiovascular: Negative.   Gastrointestinal:  Positive for abdominal pain.  Genitourinary:  Positive for dysuria, menstrual problem and pelvic pain.  Musculoskeletal:  Positive for back pain.     OBJECTIVE:   Vitals:  BP 111/73   Pulse 72   Ht 5' 4 (1.626 m)   Wt 172 lb 6.4 oz (78.2 kg)   LMP 05/08/2024 (Within Weeks)   Breastfeeding No   BMI 29.59 kg/m   Physical Exam Vitals reviewed.  Constitutional:      General: She is not in acute distress.    Appearance: Normal appearance.  HENT:     Head: Normocephalic.  Cardiovascular:     Rate and Rhythm: Normal rate.  Pulmonary:     Effort: Pulmonary effort is normal.  Abdominal:      Palpations: Abdomen is soft.     Tenderness: There is no abdominal tenderness.  Genitourinary:    General: Normal vulva.     Labia:        Right: No lesion.        Left: No lesion.      Vagina: Normal.     Cervix: No cervical motion tenderness.     Uterus: Not enlarged and not tender.      Adnexa:        Right: No mass or tenderness.         Left: No mass or tenderness.       Comments: IUD strings visible and appear appropriate length. IUD not palpated on bimanual Skin:    General: Skin is warm and dry.  Neurological:     General: No focal deficit present.     Mental Status: She is alert and oriented to person, place, and time.  Psychiatric:        Mood and Affect: Mood normal.        Behavior: Behavior normal.     Results: No results found for this or any previous visit (from the past 24 hours).   Assessment/Plan: Dysuria - Plan: Urinalysis, Routine w reflex microscopic, POCT Urinalysis Dipstick, Urine Culture, CANCELED: Urine Culture, CANCELED: Urine Culture, CANCELED: Urinalysis, Routine w reflex microscopic  Pelvic pain - Plan: Urinalysis, Routine w reflex microscopic, POCT Urinalysis Dipstick, Urine Culture, CANCELED: Urine Culture, CANCELED: Urine Culture, CANCELED: Urinalysis, Routine w reflex microscopic  Screening examination for STI - Plan: Cervicovaginal ancillary only, Cervicovaginal ancillary only  Generalized anxiety disorder W/ panic  attacks   Await ultrasound results, scheduled 9/16, to assess IUD position. Swabs  & urine collected to assess for infection. Continue current doses of Zoloft  & gabapentin .  No orders of the defined types were placed in this encounter.    Harlene LITTIE Cisco, CNM 07/12/2024 7:05 PM

## 2024-07-18 LAB — URINE CULTURE

## 2024-07-19 MED ORDER — NITROFURANTOIN MONOHYD MACRO 100 MG PO CAPS: 100.0000 mg | ORAL_CAPSULE | Freq: Two times a day (BID) | ORAL | 1 refills | Status: AC

## 2024-07-19 NOTE — Addendum Note (Signed)
 Addended by: JAYNE RAISIN on: 07/19/2024 10:45 AM   Modules accepted: Orders

## 2024-07-24 ENCOUNTER — Telehealth: Payer: Self-pay | Admitting: Obstetrics and Gynecology

## 2024-07-24 ENCOUNTER — Other Ambulatory Visit: Payer: MEDICAID

## 2024-07-24 NOTE — Telephone Encounter (Signed)
 Reached out to pt to reschedule GYN US  that scheduled on 07/24/2024 at 9:15 per [facility].  Left message for pt to call back.

## 2024-07-25 NOTE — Telephone Encounter (Signed)
 Pt has been rescheduled to 08/07/2024 for the GYN US .

## 2024-08-02 ENCOUNTER — Other Ambulatory Visit: Payer: Self-pay | Admitting: Certified Nurse Midwife

## 2024-08-07 ENCOUNTER — Ambulatory Visit (INDEPENDENT_AMBULATORY_CARE_PROVIDER_SITE_OTHER): Payer: MEDICAID

## 2024-08-07 ENCOUNTER — Other Ambulatory Visit: Payer: Self-pay | Admitting: Certified Nurse Midwife

## 2024-08-07 ENCOUNTER — Encounter: Payer: Self-pay | Admitting: Obstetrics and Gynecology

## 2024-08-07 ENCOUNTER — Ambulatory Visit: Payer: MEDICAID | Admitting: Obstetrics and Gynecology

## 2024-08-07 VITALS — BP 93/59 | HR 60 | Ht 64.0 in | Wt 191.0 lb

## 2024-08-07 DIAGNOSIS — Z3169 Encounter for other general counseling and advice on procreation: Secondary | ICD-10-CM

## 2024-08-07 DIAGNOSIS — Z30432 Encounter for removal of intrauterine contraceptive device: Secondary | ICD-10-CM

## 2024-08-07 DIAGNOSIS — M545 Low back pain, unspecified: Secondary | ICD-10-CM

## 2024-08-07 DIAGNOSIS — G8929 Other chronic pain: Secondary | ICD-10-CM

## 2024-08-07 DIAGNOSIS — N921 Excessive and frequent menstruation with irregular cycle: Secondary | ICD-10-CM | POA: Diagnosis not present

## 2024-08-07 DIAGNOSIS — Z30431 Encounter for routine checking of intrauterine contraceptive device: Secondary | ICD-10-CM | POA: Diagnosis not present

## 2024-08-07 NOTE — Progress Notes (Signed)
 Cristopher Suzen HERO, NP   Chief Complaint  Patient presents with   IUD Removal    Plans to conceive    HPI:      [name] is a 33 y.o. [phone] whose LMP was Patient's last menstrual period was 07/15/2024 (approximate)., presents today for IUD removal and f/u GYN u/s from today for bleeding/pelvic and low back pain with IUD earlier this month. Had been amenorrheic with Mirena , bleeding has stopped. Now would like to conceive so wants IUD removed. Still having LBP--hx of recent fall, taking gabapentin  prn pain. Not taking PNVs.  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   Opioid use disorder, mild, in sustained remission, on maintenance therapy (HCC) 08/12/2022   Generalized anxiety disorder W/ panic attacks  08/12/2022   Panic attack 07/19/2022    Past Surgical History:  Procedure Laterality Date   CESAREAN SECTION  02/25/2023   Procedure: CESAREAN SECTION;  Surgeon: Janit Alm Agent, MD;  Location: [facility] ORS;  Service: Obstetrics;;   I & D EXTREMITY Left 04/08/2016   Procedure: IRRIGATION AND DEBRIDEMENT OF HAND;  Surgeon: Balinda Rogue, MD;  Location: [facility] OR;  Service: Plastics;  Laterality: Left;   MULTIPLE TOOTH EXTRACTIONS     9 d/t peridontal disease   OVARIAN CYST REMOVAL     TONSILLECTOMY     WISDOM TOOTH EXTRACTION     three; between 2021 and 2023    Family History  Problem Relation Age of Onset   Breast cancer Mother 68   Lung cancer Mother    Breast cancer Maternal Grandmother 38       late years   Diabetes Maternal Grandmother    Heart disease Maternal Grandmother    Alcoholism Paternal Grandmother    Alcoholism Paternal Grandfather    Other Paternal Freight forwarder like agent orange and stuff   Breast cancer Maternal Great-grandmother        30s    Social History   Socioeconomic History   Marital status: Significant Other     Spouse name: Lamar   Number of children: 0   Years of education: 12   Highest education level: Not on file  Occupational History   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    Comment: Mirena   Other Topics Concern   Not on file  Social History Narrative   Not on file   Social Drivers of Health   Financial Resource Strain: Medium Risk (01/30/2024)   Received from [facility]   Overall Financial Resource Strain (CARDIA)    Difficulty of Paying Living Expenses: Somewhat hard  Food Insecurity: Food Insecurity Present (01/30/2024)   Received from [facility]   Hunger Vital Sign    Within the past 12 months, you worried that your food would run  out before you got the money to buy more.: Often true    Within the past 12 months, the food you bought just didn't last and you didn't have money to get more.: Sometimes true  Transportation Needs: No Transportation Needs (01/30/2024)   Received from [facility]   PRAPARE - Transportation    Lack of Transportation (Medical): No    Lack of Transportation (Non-Medical): No  Physical Activity: Insufficiently Active (01/30/2024)   Received from [facility]   Exercise Vital Sign    On average, how many days per week do you engage in moderate to strenuous exercise (like a brisk walk)?: 4 days    On average, how many minutes do you engage in exercise at this level?: 30 min  Stress: Stress Concern Present (01/30/2024)   Received from [facility] - Occupational  Stress Questionnaire    Feeling of Stress : Very much  Social Connections: Socially Integrated (01/30/2024)   Received from [facility]   Social Network    How would you rate your social network (family, work, friends)?: Good participation with social networks  Intimate Partner Violence: Not At Risk (01/30/2024)   Received from [facility]   HITS    Over the last 12 months how often did your partner physically hurt you?: Never    Over the last 12 months how often did your partner insult you or talk down to you?: Never    Over the last 12 months how often did your partner threaten you with physical harm?: Never    Over the last 12 months how often did your partner scream or curse at you?: Never    Outpatient Medications Prior to Visit  Medication Sig Dispense Refill   acetaminophen  (TYLENOL ) 325 MG tablet Take 1 tablet by mouth as needed.     ALPRAZolam  (XANAX ) 1 MG tablet Take 1 mg by mouth 3 (three) times daily as needed.     gabapentin  (NEURONTIN ) 100 MG capsule TAKE 2 CAPSULES BY MOUTH 3 TIMES DAILY. 180 capsule 0   levonorgestrel  (MIRENA ) 20 MCG/DAY IUD 1 each by Intrauterine route once.     methadone  (DOLOPHINE ) 10 MG/ML solution Take 190 mg by mouth daily. Veriried with [facility] that current dose is 190 mg QD     nitrofurantoin , macrocrystal-monohydrate, (MACROBID ) 100 MG capsule Take 1 capsule (100 mg total) by mouth 2 (two) times daily. 14 capsule 1   sertraline  (ZOLOFT ) 100 MG tablet TAKE 2 TABLETS BY MOUTH EVERY DAY 180 tablet 1   No facility-administered medications prior to visit.      ROS:  Review of Systems  Constitutional:  Negative for fever.  Gastrointestinal:  Negative for blood in stool, constipation, diarrhea, nausea and vomiting.  Genitourinary:  Negative for dyspareunia, dysuria, flank pain, frequency, hematuria, urgency, vaginal bleeding, vaginal discharge and vaginal pain.  Musculoskeletal:  Positive for back pain.  Skin:  Negative for  rash.   BREAST: No symptoms   OBJECTIVE:   Vitals:  BP (!) 93/59   Pulse 60   Ht 5' 4 (1.626 m)   Wt 191 lb (86.6 kg)   LMP 07/15/2024 (Approximate)   Breastfeeding No   BMI 32.79 kg/m    Physical Exam Vitals reviewed.  Constitutional:      Appearance: She is well-developed.  Pulmonary:     Effort: Pulmonary effort is normal.  Genitourinary:    General: Normal vulva.     Pubic Area: No rash.  Labia:        Right: No rash, tenderness or lesion.        Left: No rash, tenderness or lesion.      Vagina: Normal. No vaginal discharge, erythema or tenderness.     Cervix: Normal.     Uterus: Normal. Not enlarged and not tender.      Adnexa: Right adnexa normal and left adnexa normal.       Right: No mass or tenderness.         Left: No mass or tenderness.       Comments: IUD STRINGS NOT IN CX OS Musculoskeletal:        General: Normal range of motion.     Cervical back: Normal range of motion.  Skin:    General: Skin is warm and dry.  Neurological:     General: No focal deficit present.     Mental Status: She is alert and oriented to person, place, and time.  Psychiatric:        Mood and Affect: Mood normal.        Behavior: Behavior normal.        Thought Content: Thought content normal.        Judgment: Judgment normal.    Results:  GYN ULTRASOUND REPORT   Patient Name: Lorraine Paul DOB: Apr 05, 1991 MRN: [id number] LMP:      Location: [facility] Date of Service: 08/07/2024 Ordering Provider: Bernarda Schroeder, [facility]   Indications:Menorrhagia on IUD/IUD Surveillance Findings:  The uterus is anteverted and measures 7.7 x 4.2 x 4.2 cm with a uterine volume of 70.10 ml. Echo texture is heterogenous without evidence of focal masses.     The Endometrium measures 5.7 mm. The IUD appears to be situated in the proper location. Pt is Scheduled today to have IUD removed.   Right Ovary measures 3.0 x 1.7 x 2.4 cm. It is normal in  appearance. Left Ovary measures 3.0 x 1.9 x 1.7 cm. It is normal in appearance. Survey of the adnexa demonstrates no adnexal masses. There is no free fluid in the cul de sac.   Impression: 1. Heterogeneous seen. 2. IUD appears to ne in proper location.   Recommendations: 1.Clinical correlation with the patient's History and Physical Exam. 2. Patient stated that she is scheduled today to have IUD removed.   [place] DELENA Hummer, RDMS (AB,OB,BR),RVT   IUD Removal Strings of IUD identified and grasped.  IUD removed without problem with ring forceps.  Pt tolerated this well.  IUD noted to be intact.   Assessment/Plan: Acute low back pain without sciatica, unspecified back pain laterality--neg GYN u/s for pelvic/LBP. Question MSK since pt had recent fall.   Pre-conception counseling--start PNVs, discussed risks/benefits of current med list. F/u prn NOB.  Encounter for IUD removal   Return if symptoms worsen or fail to improve.  Joi Leyva B. Mardie Kellen, PA-C 08/07/2024 3:29 PM

## 2024-08-07 NOTE — Patient Instructions (Signed)
 I value your feedback and you entrusting [place] with your care. If you get a [place] patient survey, I would appreciate you taking the time to let us know about your experience today. Thank you! ? ? ?

## 2024-08-17 ENCOUNTER — Ambulatory Visit: Payer: MEDICAID | Admitting: Certified Nurse Midwife

## 2024-09-12 NOTE — Patient Instructions (Incomplete)
 First Trimester of Pregnancy  The first trimester of pregnancy starts on the first day of your last monthly period until the end of week 13. This is months 1 through 3 of pregnancy. A week after a sperm fertilizes an egg, the egg will implant into the wall of the uterus and begin to develop into a baby. Body changes during your first trimester Your body goes through many changes during pregnancy. The changes usually return to normal after your baby is born. Physical changes Your breasts may grow larger and may hurt. The area around your nipples may get darker. Your periods will stop. Your hair and nails may grow faster. You may pee more often. Health changes You may tire easily. Your gums may bleed and may be sensitive when you brush and floss. You may not feel hungry. You may have heartburn. You may throw up or feel like you may throw up. You may want to eat some foods, but not others. You may have headaches. You may have trouble pooping (constipation). Other changes Your emotions may change from day to day. You may have more dreams. Follow these instructions at home: Medicines Talk to your health care provider if you're taking medicines. Ask if the medicines are safe to take during pregnancy. Your provider may change the medicines that you take. Do not take any medicines unless told to by your provider. Take a prenatal vitamin that has at least 600 micrograms (mcg) of folic acid. Do not use herbal medicines, illegal substances, or medicines that are not approved by your provider. Eating and drinking While you're pregnant your body needs extra food for your growing baby. Talk with your provider about what to eat while pregnant. Activity Most women are able to exercise during pregnancy. Exercises may need to change as your pregnancy goes on. Talk to your provider about your activities and exercise routines. Relieving pain and discomfort Wear a good, supportive bra if your breasts  hurt. Rest with your legs raised if you have leg cramps or low back pain. Safety Wear your seatbelt at all times when you're in a car. Talk to your provider if someone hits you, hurts you, or yells at you. Talk with your provider if you're feeling sad or have thoughts of hurting yourself. Lifestyle Certain things can be harmful while you're pregnant. Follow these rules: Do not use hot tubs, steam rooms, or saunas. Do not douche. Do not use tampons or scented pads. Do not drink alcohol,smoke, vape, or use products with nicotine or tobacco in them. If you need help quitting, talk with your provider. Avoid cat litter boxes and soil used by cats. These things carry germs that can cause harm to your pregnancy and your baby. General instructions Keep all follow-up visits. It helps you and your unborn baby stay as healthy as possible. Write down your questions. Take them to your visits. Your provider will: Talk with you about your overall health. Give you advice or refer you to specialists who can help with different needs, including: Prenatal education classes. Mental health and counseling. Foods and healthy eating. Ask for help if you need help with food. Call your dentist and ask to be seen. Brush your teeth with a soft toothbrush. Floss gently. Where to find more information American Pregnancy Association: americanpregnancy.org [employer]necologists: acog.org Office on [employer] Health: [employer] Contact a health care provider if: You feel dizzy, faint, or have a fever. You vomit or have watery poop (diarrhea) for 2  days or more. You have abnormal discharge or bleeding from your vagina. You have pain when you pee or your pee smells bad. You have cramps, pain, or pressure in your belly area. Get help right away if: You have trouble breathing or chest pain. You have any kind of injury, such as from a fall or a car crash. These symptoms may be an  emergency. Get help right away. Call 911. Do not wait to see if the symptoms will go away. Do not drive yourself to the hospital. This information is not intended to replace advice given to you by your health care provider. Make sure you discuss any questions you have with your health care provider. Document Revised: 07/28/2023 Document Reviewed: 02/25/2023 Elsevier Patient Education  2024 Elsevier Inc.   Common Medications Safe in Pregnancy  Acne:      Constipation:  Benzoyl Peroxide     Colace  Clindamycin      Dulcolax Suppository  Topica Erythromycin     Fibercon  Salicylic Acid      Metamucil         Miralax AVOID:        Senakot   Accutane    Cough:  Retin-A       Cough Drops  Tetracycline      Phenergan w/ Codeine if Rx  Minocycline      Robitussin (Plain & DM)  Antibiotics:     Crabs/Lice:  Ceclor       RID  Cephalosporins    AVOID:  E-Mycins      Kwell  Keflex  Macrobid/Macrodantin   Diarrhea:  Penicillin      Kao-Pectate  Zithromax      Imodium AD         PUSH FLUIDS AVOID:       Cipro     Fever:  Tetracycline      Tylenol (Regular or Extra  Minocycline       Strength)  Levaquin      Extra Strength-Do not          Exceed 8 tabs/24 hrs Caffeine:        200mg /day (equiv. To 1 cup of coffee or  approx. 3 12 oz sodas)         Gas: Cold/Hayfever:       Gas-X  Benadryl      Mylicon  Claritin       Phazyme  **Claritin-D        Chlor-Trimeton    Headaches:  Dimetapp      ASA-Free Excedrin  Drixoral-Non-Drowsy     Cold Compress  Mucinex (Guaifenasin)     Tylenol (Regular or Extra  Sudafed/Sudafed-12 Hour     Strength)  **Sudafed PE Pseudoephedrine   Tylenol Cold & Sinus     Vicks Vapor Rub  Zyrtec  **AVOID if Problems With Blood Pressure         Heartburn: Avoid lying down for at least 1 hour after meals  Aciphex      Maalox     Rash:  Milk of Magnesia     Benadryl    Mylanta       1% Hydrocortisone Cream  Pepcid  Pepcid Complete   Sleep  Aids:  Prevacid      Ambien   Prilosec       Benadryl  Rolaids       Chamomile Tea  Tums (Limit 4/day)     Unisom  Tylenol PM         Warm milk-add vanilla or  Hemorrhoids:       Sugar for taste  Anusol/Anusol H.C.  (RX: Analapram 2.5%)  Sugar Substitutes:  Hydrocortisone OTC     Ok in moderation  Preparation H      Tucks        Vaseline lotion applied to tissue with wiping    Herpes:     Throat:  Acyclovir      Oragel  Famvir  Valtrex     Vaccines:         Flu Shot Leg Cramps:       *Gardasil  Benadryl      Hepatitis A         Hepatitis B Nasal Spray:       Pneumovax  Saline Nasal Spray     Polio Booster         Tetanus Nausea:       Tuberculosis test or PPD  Vitamin B6 25 mg TID   AVOID:    Dramamine      *Gardasil  Emetrol       Live Poliovirus  Ginger Root 250 mg QID    MMR (measles, mumps &  High Complex Carbs @ Bedtime    rebella)  Sea Bands-Accupressure    Varicella (Chickenpox)  Unisom 1/2 tab TID     *No known complications           If received before Pain:         Known pregnancy;   Darvocet       Resume series after  Lortab        Delivery  Percocet    Yeast:   Tramadol      Femstat  Tylenol 3      Gyne-lotrimin  Ultram       Monistat  Vicodin           MISC:         All Sunscreens           Hair Coloring/highlights          Insect Repellant's          (Including DEET)         Mystic Tans   Commonly Asked Questions During Pregnancy   Cats: A parasite can be excreted in cat feces.  To avoid exposure you need to have another person empty the little box.  If you must empty the litter box you will need to wear gloves.  Wash your hands after handling your cat.  This parasite can also be found in raw or undercooked meat so this should also be avoided.  Colds, Sore Throats, Flu: Please check your medication sheet to see what you can take for symptoms.  If your symptoms are unrelieved by these medications please call the office.  Dental Work: Most  any dental work Agricultural consultant recommends is permitted.  X-rays should only be taken during the first trimester if absolutely necessary.  Your abdomen should be shielded with a lead apron during all x-rays.  Please notify your provider prior to receiving any x-rays.  Novocaine is fine; gas is not recommended.  If your dentist requires a note from [place] prior to dental work please call the office and we will provide one for you.  Exercise: Exercise is an important part of staying healthy during your pregnancy.  You may continue most exercises you were accustomed to prior to pregnancy.  Later in your pregnancy you will most likely notice you have difficulty with activities requiring balance like riding a bicycle.  It is important that you listen to your body and avoid activities that put you at a higher risk of falling.  Adequate rest and staying well hydrated are a must!  If you have questions about the safety of specific activities ask your provider.    Exposure to Children with illness: Try to avoid obvious exposure; report any symptoms to [place] when noted,  If you have chicken pos, red measles or mumps, you should be immune to these diseases.   Please do not take any vaccines while pregnant unless you have checked with your OB provider.  Fetal Movement: After 28 weeks we recommend you do "kick counts" twice daily.  Lie or sit down in a calm quiet environment and count your baby movements "kicks".  You should feel your baby at least 10 times per hour.  If you have not felt 10 kicks within the first hour get up, walk around and have something sweet to eat or drink then repeat for an additional hour.  If count remains less than 10 per hour notify your provider.  Fumigating: Follow your pest control agent's advice as to how long to stay out of your home.  Ventilate the area well before re-entering.  Hemorrhoids:   Most over-the-counter preparations can be used during pregnancy.  Check your medication to see what is  safe to use.  It is important to use a stool softener or fiber in your diet and to drink lots of liquids.  If hemorrhoids seem to be getting worse please call the office.   Hot Tubs:  Hot tubs Jacuzzis and saunas are not recommended while pregnant.  These increase your internal body temperature and should be avoided.  Intercourse:  Sexual intercourse is safe during pregnancy as long as you are comfortable, unless otherwise advised by your provider.  Spotting may occur after intercourse; report any bright red bleeding that is heavier than spotting.  Labor:  If you know that you are in labor, please go to the hospital.  If you are unsure, please call the office and let us help you decide what to do.  Lifting, straining, etc:  If your job requires heavy lifting or straining please check with your provider for any limitations.  Generally, you should not lift items heavier than that you can lift simply with your hands and arms (no back muscles)  Painting:  Paint fumes do not harm your pregnancy, but may make you ill and should be avoided if possible.  Latex or water based paints have less odor than oils.  Use adequate ventilation while painting.  Permanents & Hair Color:  Chemicals in hair dyes are not recommended as they cause increase hair dryness which can increase hair loss during pregnancy.  " Highlighting" and permanents are allowed.  Dye may be absorbed differently and permanents may not hold as well during pregnancy.  Sunbathing:  Use a sunscreen, as skin burns easily during pregnancy.  Drink plenty of fluids; avoid over heating.  Tanning Beds:  Because their possible side effects are still unknown, tanning beds are not recommended.  Ultrasound Scans:  Routine ultrasounds are performed at approximately 20 weeks.  You will be able to see your baby's general anatomy an if you would like to know the gender this can usually be determined as well.  If it is questionable when you conceived you may  also  receive an ultrasound early in your pregnancy for dating purposes.  Otherwise ultrasound exams are not routinely performed unless there is a medical necessity.  Although you can request a scan we ask that you pay for it when conducted because insurance does not cover " patient request" scans.  Work: If your pregnancy proceeds without complications you may work until your due date, unless your physician or employer advises otherwise.  Round Ligament Pain/Pelvic Discomfort:  Sharp, shooting pains not associated with bleeding are fairly common, usually occurring in the second trimester of pregnancy.  They tend to be worse when standing up or when you remain standing for long periods of time.  These are the result of pressure of certain pelvic ligaments called "round ligaments".  Rest, Tylenol and heat seem to be the most effective relief.  As the womb and fetus grow, they rise out of the pelvis and the discomfort improves.  Please notify the office if your pain seems different than that described.  It may represent a more serious condition.

## 2024-09-13 ENCOUNTER — Ambulatory Visit: Payer: MEDICAID

## 2024-09-19 ENCOUNTER — Other Ambulatory Visit: Payer: Self-pay | Admitting: Certified Nurse Midwife

## 2024-09-19 DIAGNOSIS — G8929 Other chronic pain: Secondary | ICD-10-CM

## 2024-10-01 ENCOUNTER — Ambulatory Visit: Payer: MEDICAID
